# Patient Record
Sex: Female | Born: 1990
Health system: Southern US, Community
[De-identification: ages and names within clinical notes are randomized; demographics above are authoritative.]

## PROBLEM LIST (undated history)

## (undated) DIAGNOSIS — N946 Dysmenorrhea, unspecified: Secondary | ICD-10-CM

## (undated) DIAGNOSIS — K8020 Calculus of gallbladder without cholecystitis without obstruction: Secondary | ICD-10-CM

## (undated) DIAGNOSIS — R51 Headache: Secondary | ICD-10-CM

## (undated) HISTORY — PX: TEAR DUCT PROBING: SHX793

## (undated) HISTORY — DX: Dysmenorrhea, unspecified: N94.6

---

## 2014-03-16 ENCOUNTER — Encounter (HOSPITAL_COMMUNITY): Payer: Self-pay | Admitting: Emergency Medicine

## 2014-03-16 ENCOUNTER — Emergency Department (HOSPITAL_COMMUNITY)
Admission: EM | Admit: 2014-03-16 | Discharge: 2014-03-16 | Disposition: A | Discharge status: Home / Self Care | Payer: Self-pay | Attending: Emergency Medicine | Admitting: Emergency Medicine

## 2014-03-16 ENCOUNTER — Emergency Department (HOSPITAL_COMMUNITY): Payer: Self-pay

## 2014-03-16 DIAGNOSIS — K8020 Calculus of gallbladder without cholecystitis without obstruction: Secondary | ICD-10-CM | POA: Insufficient documentation

## 2014-03-16 DIAGNOSIS — R1011 Right upper quadrant pain: Secondary | ICD-10-CM

## 2014-03-16 DIAGNOSIS — Z3202 Encounter for pregnancy test, result negative: Secondary | ICD-10-CM | POA: Insufficient documentation

## 2014-03-16 DIAGNOSIS — Z720 Tobacco use: Secondary | ICD-10-CM | POA: Insufficient documentation

## 2014-03-16 LAB — URINALYSIS, ROUTINE W REFLEX MICROSCOPIC
BILIRUBIN URINE: NEGATIVE
GLUCOSE, UA: NEGATIVE mg/dL
Hgb urine dipstick: NEGATIVE
Ketones, ur: NEGATIVE mg/dL
Nitrite: NEGATIVE
PH: 6.5 (ref 5.0–8.0)
Protein, ur: NEGATIVE mg/dL
SPECIFIC GRAVITY, URINE: 1.027 (ref 1.005–1.030)
Urobilinogen, UA: 1 mg/dL (ref 0.0–1.0)

## 2014-03-16 LAB — COMPREHENSIVE METABOLIC PANEL
ALT: 16 U/L (ref 0–35)
AST: 14 U/L (ref 0–37)
Albumin: 3.8 g/dL (ref 3.5–5.2)
Alkaline Phosphatase: 48 U/L (ref 39–117)
Anion gap: 14 (ref 5–15)
BUN: 11 mg/dL (ref 6–23)
CHLORIDE: 100 meq/L (ref 96–112)
CO2: 22 meq/L (ref 19–32)
CREATININE: 0.68 mg/dL (ref 0.50–1.10)
Calcium: 9.5 mg/dL (ref 8.4–10.5)
GFR calc Af Amer: >90 mL/min (ref >90)
Glucose, Bld: 138 mg/dL — ABNORMAL HIGH (ref 70–99)
Potassium: 3.9 mEq/L (ref 3.7–5.3)
Sodium: 136 mEq/L — ABNORMAL LOW (ref 137–147)
Total Bilirubin: 0.2 mg/dL — ABNORMAL LOW (ref 0.3–1.2)
Total Protein: 8.2 g/dL (ref 6.0–8.3)

## 2014-03-16 LAB — CBC WITH DIFFERENTIAL/PLATELET
Basophils Absolute: 0 10*3/uL (ref 0.0–0.1)
Basophils Relative: 0 % (ref 0–1)
Eosinophils Absolute: 0.4 10*3/uL (ref 0.0–0.7)
Eosinophils Relative: 6 % — ABNORMAL HIGH (ref 0–5)
HEMATOCRIT: 40.9 % (ref 36.0–46.0)
HEMOGLOBIN: 13.5 g/dL (ref 12.0–15.0)
LYMPHS PCT: 52 % — AB (ref 12–46)
Lymphs Abs: 3.7 10*3/uL (ref 0.7–4.0)
MCH: 28.1 pg (ref 26.0–34.0)
MCHC: 33 g/dL (ref 30.0–36.0)
MCV: 85 fL (ref 78.0–100.0)
MONO ABS: 0.6 10*3/uL (ref 0.1–1.0)
Monocytes Relative: 8 % (ref 3–12)
NEUTROS PCT: 34 % — AB (ref 43–77)
Neutro Abs: 2.4 10*3/uL (ref 1.7–7.7)
Platelets: 278 10*3/uL (ref 150–400)
RBC: 4.81 MIL/uL (ref 3.87–5.11)
RDW: 12.5 % (ref 11.5–15.5)
WBC: 7.1 10*3/uL (ref 4.0–10.5)

## 2014-03-16 LAB — URINE MICROSCOPIC-ADD ON

## 2014-03-16 LAB — POC URINE PREG, ED: Preg Test, Ur: NEGATIVE

## 2014-03-16 LAB — LIPASE, BLOOD: LIPASE: 22 U/L (ref 11–59)

## 2014-03-16 MED ORDER — MORPHINE SULFATE 4 MG/ML IJ SOLN
2.0000 mg | Freq: Once | INTRAMUSCULAR | Status: AC
  Administered 2014-03-16: 2 mg via INTRAVENOUS
  Filled 2014-03-16: qty 1

## 2014-03-16 MED ORDER — SODIUM CHLORIDE 0.9 % IV BOLUS (SEPSIS)
1000.0000 mL | Freq: Once | INTRAVENOUS | Status: AC
  Administered 2014-03-16: 1000 mL via INTRAVENOUS

## 2014-03-16 MED ORDER — FAMOTIDINE IN NACL 20-0.9 MG/50ML-% IV SOLN
20.0000 mg | Freq: Once | INTRAVENOUS | Status: AC
  Administered 2014-03-16: 20 mg via INTRAVENOUS
  Filled 2014-03-16: qty 50

## 2014-03-16 MED ORDER — HYDROCODONE-ACETAMINOPHEN 5-325 MG PO TABS: 1.0000 | ORAL_TABLET | Freq: Four times a day (QID) | ORAL | Status: DC | PRN

## 2014-03-16 MED ORDER — ONDANSETRON HCL 4 MG PO TABS: 4.0000 mg | ORAL_TABLET | Freq: Four times a day (QID) | ORAL | Status: DC

## 2014-03-16 MED ORDER — ONDANSETRON HCL 4 MG/2ML IJ SOLN
4.0000 mg | Freq: Once | INTRAMUSCULAR | Status: AC
  Administered 2014-03-16: 4 mg via INTRAVENOUS
  Filled 2014-03-16: qty 2

## 2014-03-16 NOTE — ED Notes (Signed)
Per PT[facility]: Pt c/o R flank pain radiating to R lower abdomen. Pt states pain started shortly after eating ice cream this evening. Pt reports feeling very nauseous, dry heaving but no vomiting. Denies urinary sx. A&O x 4. Ambulatory.

## 2014-03-16 NOTE — ED Provider Notes (Signed)
CSN: [id number]     Arrival date & time 03/16/14  0119 History   First MD Initiated Contact with Lisa Palmer 03/16/14 0246     Chief Complaint  Lisa Palmer presents with  . Flank Pain  . Abdominal Pain  . Nausea    (Consider location/radiation/quality/duration/timing/severity/associated sxs/prior Treatment) HPI Comments: Lisa Palmer is a 23 year old female who presents to the emergency department for further evaluation of abdominal pain. Lisa Palmer states that she has been experiencing pain in her right mid side as well as her right upper abdomen intermittently over the past week. She states that her pain this evening began at 2300 yesterday. She describes the pain as sharp and radiating from her right thoracic back to her right upper quadrant. Symptoms associated with nausea as well as emesis. She states that experiencing an episode of emesis usually improves her pain. She has tried taking ibuprofen with no relief of symptoms. She states that she had also tried dietary changes with past week to try and alleviate symptom recurrence. Lisa Palmer denies history of abdominal surgeries as well as associated fever, chest pain, shortness of breath, diarrhea, melena or hematochezia, hematemesis, urinary complaints, and vaginal symptoms. She does endorse frequent consumption of fatty foods.  Lisa Palmer is a 23 y.o. female presenting with flank pain and abdominal pain. The history is provided by the Lisa Palmer. No language interpreter was used.  Flank Pain Associated symptoms include abdominal pain, nausea and vomiting. Pertinent negatives include no chest pain or fever.  Abdominal Pain Associated symptoms: nausea and vomiting   Associated symptoms: no chest pain, no dysuria, no fever, no hematuria, no shortness of breath, no vaginal bleeding and no vaginal discharge     History reviewed. No pertinent past medical history. History reviewed. No pertinent past surgical history. No family history on file. History  Substance  Use Topics  . Smoking status: Current Every Day Smoker -- 0.25 packs/day  . Smokeless tobacco: Not on file  . Alcohol Use: Yes     Comment: occasional   OB History    No data available      Review of Systems  Constitutional: Negative for fever.  Respiratory: Negative for shortness of breath.   Cardiovascular: Negative for chest pain.  Gastrointestinal: Positive for nausea, vomiting and abdominal pain.  Genitourinary: Positive for flank pain. Negative for dysuria, hematuria, vaginal bleeding and vaginal discharge.  Neurological: Negative for syncope.  All other systems reviewed and are negative.   Allergies  Augmentin  Home Medications   Prior to Admission medications   Medication Sig Start Date End Date Taking? Authorizing Provider  ibuprofen (ADVIL,MOTRIN) 200 MG tablet Take 400 mg by mouth every 6 (six) hours as needed for moderate pain.   Yes Historical Provider, MD  HYDROcodone-acetaminophen (NORCO/VICODIN) 5-325 MG per tablet Take 1-2 tablets by mouth every 6 (six) hours as needed for moderate pain or severe pain. 03/16/14   Antony MaduraKelly Correll Denbow, PA-C  ondansetron (ZOFRAN) 4 MG tablet Take 1 tablet (4 mg total) by mouth every 6 (six) hours. As needed for nausea 03/16/14   Antony MaduraKelly Vannie Hilgert, PA-C   BP 124/74 mmHg  Pulse 68  Temp(Src) 98.1 F (36.7 C) (Oral)  Resp 18  SpO2 99%  LMP 03/02/2014   Physical Exam  Constitutional: She is oriented to person, place, and time. She appears well-developed and well-nourished. No distress.  Nontoxic/nonseptic appearing  HENT:  Head: Normocephalic and atraumatic.  Mouth/Throat: Oropharynx is clear and moist. No oropharyngeal exudate.  Eyes: Conjunctivae and EOM are normal. No scleral icterus.  Neck: Normal range of motion.  Cardiovascular: Normal rate, regular rhythm and normal heart sounds.   Pulmonary/Chest: Effort normal. No respiratory distress. She has no wheezes. She has no rales.  Chest expansion symmetric. Respirations unlabored   Abdominal: Soft. She exhibits no distension and no mass. There is tenderness. There is no rebound and no guarding.  Tenderness to palpation in the right upper quadrant with negative Murphy sign. No masses or peritoneal signs. Abdomen soft.  Musculoskeletal: Normal range of motion.  Neurological: She is alert and oriented to person, place, and time. She exhibits normal muscle tone. Coordination normal.  GCS 15. Speech is goal oriented.  Skin: Skin is warm and dry. No rash noted. She is not diaphoretic. No erythema. No pallor.  Psychiatric: She has a normal mood and affect. Her behavior is normal.  Nursing note and vitals reviewed.   ED Course  Procedures (including critical care time) Labs Review Labs Reviewed  CBC WITH DIFFERENTIAL - Abnormal; Notable for the following:    Neutrophils Relative % 34 (*)    Lymphocytes Relative 52 (*)    Eosinophils Relative 6 (*)    All other components within normal limits  COMPREHENSIVE METABOLIC PANEL - Abnormal; Notable for the following:    Sodium 136 (*)    Glucose, Bld 138 (*)    Total Bilirubin 0.2 (*)    All other components within normal limits  URINALYSIS, ROUTINE W REFLEX MICROSCOPIC - Abnormal; Notable for the following:    APPearance CLOUDY (*)    Leukocytes, UA SMALL (*)    All other components within normal limits  URINE MICROSCOPIC-ADD ON - Abnormal; Notable for the following:    Bacteria, UA MANY (*)    All other components within normal limits  LIPASE, BLOOD  POC URINE PREG, ED    Imaging Review [place] Abdomen Limited Ruq  03/16/2014   CLINICAL DATA:  RIGHT upper quadrant pain. Symptoms began this evening after eating ice cream.  EXAM: US ABDOMEN LIMITED - RIGHT UPPER QUADRANT  COMPARISON:  None.  FINDINGS: Gallbladder:  Two echogenic gallstones with acoustic shadowing measure up to 1.2 cm. No gallbladder wall thickening, pericholecystic fluid. No sonographic Murphy's sign elicited.  Common bile duct:  Diameter: 3 mm  Liver:  No  focal lesion identified. Within normal limits in parenchymal echogenicity.  IMPRESSION: Cholelithiasis without sonographic findings of acute cholecystitis.   Electronically Signed   By: Awilda Metroourtnay  Bloomer   On: 03/16/2014 04:32     EKG Interpretation None      MDM   Final diagnoses:  RUQ pain  Calculus of gallbladder without cholecystitis without obstruction    23 year old female presents to the emergency department for further evaluation of pain to her right lateral mid back which radiates around to her right upper quadrant. Symptoms associated with nausea and emesis. She states that emesis usually improves her pain. Lisa Palmer is willing nontoxic appearing, hemodynamically stable, and afebrile. Focal tenderness elicited in the right upper quadrant with a negative Murphy sign. No masses or peritoneal signs. Lisa Palmer noted have no cytosis, anemia, or electrolyte imbalance. Liver and kidney function preserved. Lipase normal.  Ultrasound ordered for further evaluation of symptoms which shows cholelithiasis without acute cholecystitis. No physical exam findings to suggest clinical cholecystitis. Suspected biliary colic is responsible for cause of Lisa Palmer's pain today. She does endorse eating fatty foods frequently which is likely exacerbating her symptoms. Lisa Palmer has been able to tolerate fluids in the emergency department without emesis. She states that she is  feeling better since her arrival. Do not believe further emergent workup is indicated and the Lisa Palmer is stable for discharge with referral to [facility] to follow-up on an outpatient basis. Will prescribe Norco and Zofran for symptom management. Return precautions discussed and provided. Lisa Palmer agreeable to plan with no unaddressed concerns. Lisa Palmer discharged in good condition; VSS.   Filed Vitals:   03/16/14 0122 03/16/14 0344  BP: 148/93 124/74  Pulse: 95 68  Temp: 98.1 F (36.7 C)   TempSrc: Oral   Resp: 20 18  SpO2:  100% 99%     Antony Madura, PA-C 03/16/14 0757  Lyanne Co, MD 03/16/14 [phone]

## 2014-03-16 NOTE — ED Notes (Signed)
Bed: [id number] Expected date: 03/16/14 Expected time:  Means of arrival: Hospital Transport Comments: EMS/flank pain

## 2014-03-16 NOTE — ED Notes (Signed)
POC Urine Preg resulted Neg.

## 2014-03-16 NOTE — Discharge Instructions (Signed)
Cholelithiasis °Cholelithiasis (also called gallstones) is a form of gallbladder disease in which gallstones form in your gallbladder. The gallbladder is an organ that stores bile made in the liver, which helps digest fats. Gallstones begin as small crystals and slowly grow into stones. Gallstone pain occurs when the gallbladder spasms and a gallstone is blocking the duct. Pain can also occur when a stone passes out of the duct.  °RISK FACTORS °· Being female.   °· Having multiple pregnancies. Health care providers sometimes advise removing diseased gallbladders before future pregnancies.   °· Being obese. °· Eating a diet heavy in fried foods and fat.   °· Being older than 60 years and increasing age.   °· Prolonged use of medicines containing female hormones.   °· Having diabetes mellitus.   °· Rapidly losing weight.   °· Having a family history of gallstones (heredity).   °SYMPTOMS °· Nausea.   °· Vomiting. °· Abdominal pain.   °· Yellowing of the skin (jaundice).   °· Sudden pain. It may persist from several minutes to several hours. °· Fever.   °· Tenderness to the touch.  °In some cases, when gallstones do not move into the bile duct, people have no pain or symptoms. These are called "silent" gallstones.  °TREATMENT °Silent gallstones do not need treatment. In severe cases, emergency surgery may be required. Options for treatment include: °· Surgery to remove the gallbladder. This is the most common treatment. °· Medicines. These do not always work and may take 6-12 months or more to work. °· Shock wave treatment (extracorporeal biliary lithotripsy). In this treatment an ultrasound machine sends shock waves to the gallbladder to break gallstones into smaller pieces that can pass into the intestines or be dissolved by medicine. °HOME CARE INSTRUCTIONS  °· Only take over-the-counter or prescription medicines for pain, discomfort, or fever as directed by your health care provider.   °· Follow a low-fat diet until  seen again by your health care provider. Fat causes the gallbladder to contract, which can result in pain.   °· Follow up with your health care provider as directed. Attacks are almost always recurrent and surgery is usually required for permanent treatment.   °SEEK IMMEDIATE MEDICAL CARE IF:  °· Your pain increases and is not controlled by medicines.   °· You have a fever or persistent symptoms for more than 2-3 days.   °· You have a fever and your symptoms suddenly get worse.   °· You have persistent nausea and vomiting.   °MAKE SURE YOU:  °· Understand these instructions. °· Will watch your condition. °· Will get help right away if you are not doing well or get worse. °Document Released: 04/10/2005 Document Revised: 12/15/2012 Document Reviewed: 10/06/2012 °Exit[employer]® Patient Information ©2015 [employer]. This information is not intended to replace advice given to you by your health care provider. Make sure you discuss any questions you have with your health care provider. ° °Biliary Colic  °Biliary colic is a steady or irregular pain in the upper abdomen. It is usually under the right side of the rib cage. It happens when gallstones interfere with the normal flow of bile from the gallbladder. Bile is a liquid that helps to digest fats. Bile is made in the liver and stored in the gallbladder. When you eat a meal, bile passes from the gallbladder through the cystic duct and the common bile duct into the small intestine. There, it mixes with partially digested food. If a gallstone blocks either of these ducts, the normal flow of bile is blocked. The muscle cells in the bile duct contract   forcefully to try to move the stone. This causes the pain of biliary colic.  SYMPTOMS   A person with biliary colic usually complains of pain in the upper abdomen. This pain can be:  In the center of the upper abdomen just below the breastbone.  In the upper-right part of the abdomen, near the gallbladder and  liver.  Spread back toward the right shoulder blade.  Nausea and vomiting.  The pain usually occurs after eating.  Biliary colic is usually triggered by the digestive system's demand for bile. The demand for bile is high after fatty meals. Symptoms can also occur when a person who has been fasting suddenly eats a very large meal. Most episodes of biliary colic pass after 1 to 5 hours. After the most intense pain passes, your abdomen may continue to ache mildly for about 24 hours. DIAGNOSIS  After you describe your symptoms, your caregiver will perform a physical exam. He or she will pay attention to the upper right portion of your belly (abdomen). This is the area of your liver and gallbladder. An ultrasound will help your caregiver look for gallstones. Specialized scans of the gallbladder may also be done. Blood tests may be done, especially if you have fever or if your pain persists. PREVENTION  Biliary colic can be prevented by controlling the risk factors for gallstones. Some of these risk factors, such as heredity, increasing age, and pregnancy are a normal part of life. Obesity and a high-fat diet are risk factors you can change through a healthy lifestyle. Women going through menopause who take hormone replacement therapy (estrogen) are also more likely to develop biliary colic. TREATMENT   Pain medication may be prescribed.  You may be encouraged to eat a fat-free diet.  If the first episode of biliary colic is severe, or episodes of colic keep retuning, surgery to remove the gallbladder (cholecystectomy) is usually recommended. This procedure can be done through small incisions using an instrument called a laparoscope. The procedure often requires a brief stay in the hospital. Some people can leave the hospital the same day. It is the most widely used treatment in people troubled by painful gallstones. It is effective and safe, with no complications in more than 90% of cases.  If  surgery cannot be done, medication that dissolves gallstones may be used. This medication is expensive and can take months or years to work. Only small stones will dissolve.  Rarely, medication to dissolve gallstones is combined with a procedure called shock-wave lithotripsy. This procedure uses carefully aimed shock waves to break up gallstones. In many people treated with this procedure, gallstones form again within a few years. PROGNOSIS  If gallstones block your cystic duct or common bile duct, you are at risk for repeated episodes of biliary colic. There is also a 25% chance that you will develop a gallbladder infection(acute cholecystitis), or some other complication of gallstones within 10 to 20 years. If you have surgery, schedule it at a time that is convenient for you and at a time when you are not sick. HOME CARE INSTRUCTIONS   Drink plenty of clear fluids.  Avoid fatty, greasy or fried foods, or any foods that make your pain worse.  Take medications as directed. SEEK MEDICAL CARE IF:   You develop a fever over 100.5 F (38.1 C).  Your pain gets worse over time.  You develop nausea that prevents you from eating and drinking.  You develop vomiting. SEEK IMMEDIATE MEDICAL CARE IF:  You have continuous or severe belly (abdominal) pain which is not relieved with medications.  You develop nausea and vomiting which is not relieved with medications.  You have symptoms of biliary colic and you suddenly develop a fever and shaking chills. This may signal cholecystitis. Call your caregiver immediately.  You develop a yellow color to your skin or the white part of your eyes (jaundice). Document Released: 09/15/2005 Document Revised: 07/07/2011 Document Reviewed: 11/25/2007 [facility] Patient Information 2015 [employer], [place]. This information is not intended to replace advice given to you by your health care provider. Make sure you discuss any questions you have with your health care  provider.

## 2014-06-03 ENCOUNTER — Encounter (HOSPITAL_COMMUNITY): Payer: Self-pay | Admitting: *Deleted

## 2014-06-03 ENCOUNTER — Emergency Department (INDEPENDENT_AMBULATORY_CARE_PROVIDER_SITE_OTHER)
Admission: EM | Admit: 2014-06-03 | Discharge: 2014-06-03 | Disposition: A | Discharge status: Home / Self Care | Payer: No Typology Code available for payment source | Source: Home / Self Care | Attending: Emergency Medicine | Admitting: Emergency Medicine

## 2014-06-03 DIAGNOSIS — B9789 Other viral agents as the cause of diseases classified elsewhere: Principal | ICD-10-CM

## 2014-06-03 DIAGNOSIS — J069 Acute upper respiratory infection, unspecified: Secondary | ICD-10-CM

## 2014-06-03 MED ORDER — CETIRIZINE HCL 10 MG PO TABS: 10.0000 mg | ORAL_TABLET | Freq: Every day | ORAL | Status: DC

## 2014-06-03 MED ORDER — IPRATROPIUM BROMIDE 0.06 % NA SOLN: 2.0000 | Freq: Four times a day (QID) | NASAL | Status: DC

## 2014-06-03 MED ORDER — HYDROCODONE-HOMATROPINE 5-1.5 MG/5ML PO SYRP: 5.0000 mL | ORAL_SOLUTION | Freq: Four times a day (QID) | ORAL | Status: DC | PRN

## 2014-06-03 NOTE — ED Provider Notes (Signed)
CSN: [id number]     Arrival date & time 06/03/14  1452 History   First MD Initiated Contact with Patient 06/03/14 1527     Chief Complaint  Patient presents with  . URI   (Consider location/radiation/quality/duration/timing/severity/associated sxs/prior Treatment) HPI She's a 24 year old woman here for evaluation of cold symptoms. Her symptoms started about 3 days ago with cough, nasal congestion, diarrhea. No fever or chills. No headaches. No sore throat. No nausea or vomiting. She has tried over-the-counter cough syrups without much improvement.  History reviewed. No pertinent past medical history. History reviewed. No pertinent past surgical history. History reviewed. No pertinent family history. History  Substance Use Topics  . Smoking status: Current Every Day Smoker -- 0.25 packs/day  . Smokeless tobacco: Not on file  . Alcohol Use: Yes     Comment: occasional   OB History    No data available     Review of Systems  Constitutional: Negative for fever and chills.  HENT: Positive for congestion and rhinorrhea. Negative for sinus pressure and sore throat.   Respiratory: Positive for cough. Negative for shortness of breath.   Gastrointestinal: Positive for diarrhea. Negative for nausea and vomiting.  Musculoskeletal: Negative for myalgias.  Neurological: Negative for headaches.    Allergies  Augmentin  Home Medications   Prior to Admission medications   Medication Sig Start Date End Date Taking? Authorizing Provider  cetirizine (ZYRTEC) 10 MG tablet Take 1 tablet (10 mg total) by mouth daily. 06/03/14   Charm RingsErin J Dimitry Holsworth, MD  HYDROcodone-acetaminophen (NORCO/VICODIN) 5-325 MG per tablet Take 1-2 tablets by mouth every 6 (six) hours as needed for moderate pain or severe pain. 03/16/14   Antony MaduraKelly Humes, PA-C  HYDROcodone-homatropine (HYCODAN) 5-1.5 MG/5ML syrup Take 5 mLs by mouth every 6 (six) hours as needed for cough. 06/03/14   Charm RingsErin J Almarosa Bohac, MD  ibuprofen (ADVIL,MOTRIN) 200 MG  tablet Take 400 mg by mouth every 6 (six) hours as needed for moderate pain.    Historical Provider, MD  ipratropium (ATROVENT) 0.06 % nasal spray Place 2 sprays into both nostrils 4 (four) times daily. 06/03/14   Charm RingsErin J Maedell Hedger, MD  ondansetron (ZOFRAN) 4 MG tablet Take 1 tablet (4 mg total) by mouth every 6 (six) hours. As needed for nausea 03/16/14   Antony MaduraKelly Humes, PA-C   BP 140/92 mmHg  Pulse 90  Temp(Src) 99.4 F (37.4 C) (Oral)  Resp 18  SpO2 99% Physical Exam  Constitutional: She is oriented to person, place, and time. She appears well-developed and well-nourished. No distress.  HENT:  Head: Normocephalic and atraumatic.  Right Ear: External ear normal.  Left Ear: External ear normal.  Nose: Mucosal edema and rhinorrhea present.  Mouth/Throat: Posterior oropharyngeal erythema present. No oropharyngeal exudate.  Neck: Neck supple.  Cardiovascular: Normal rate, regular rhythm and normal heart sounds.   No murmur heard. Pulmonary/Chest: Effort normal and breath sounds normal. No respiratory distress. She has no wheezes. She has no rales.  Lymphadenopathy:    She has no cervical adenopathy.  Neurological: She is alert and oriented to person, place, and time.    ED Course  Procedures (including critical care time) Labs Review Labs Reviewed - No data to display  Imaging Review No results found.   MDM   1. Viral URI with cough    Symptomatic treatment with Zyrtec and Atrovent nasal spray. Discussed expected time course. Follow-up as needed.    Charm RingsErin J Jaylyn Booher, MD 06/03/14 [phone]

## 2014-06-03 NOTE — Discharge Instructions (Signed)
You have a bad cold. Take Zyrtec daily. Use Atrovent nasal spray 4 times a day. You can use over-the-counter Afrin twice a day for no more than 3 days. You should see improvement by Wednesday. Follow-up as needed.

## 2014-06-03 NOTE — ED Notes (Signed)
Pt      Reports        Symptoms     Of  Cough    Congested           Body  Aches   As    Well  As  Diarrhea   With   Onset  Of  Symptoms      X  3  Days  - pt  Is  Sitting  Upright on  The  Exam table  Speaking in  Complete  sentances

## 2014-07-16 ENCOUNTER — Emergency Department (HOSPITAL_COMMUNITY)
Admission: EM | Admit: 2014-07-16 | Discharge: 2014-07-16 | Disposition: A | Discharge status: Home / Self Care | Payer: No Typology Code available for payment source | Attending: Emergency Medicine | Admitting: Emergency Medicine

## 2014-07-16 ENCOUNTER — Encounter (HOSPITAL_COMMUNITY): Payer: Self-pay | Admitting: Emergency Medicine

## 2014-07-16 ENCOUNTER — Emergency Department (HOSPITAL_COMMUNITY): Payer: No Typology Code available for payment source

## 2014-07-16 DIAGNOSIS — Z720 Tobacco use: Secondary | ICD-10-CM | POA: Diagnosis not present

## 2014-07-16 DIAGNOSIS — R1011 Right upper quadrant pain: Secondary | ICD-10-CM | POA: Diagnosis present

## 2014-07-16 DIAGNOSIS — Z79899 Other long term (current) drug therapy: Secondary | ICD-10-CM | POA: Diagnosis not present

## 2014-07-16 DIAGNOSIS — Z3202 Encounter for pregnancy test, result negative: Secondary | ICD-10-CM | POA: Insufficient documentation

## 2014-07-16 DIAGNOSIS — K8020 Calculus of gallbladder without cholecystitis without obstruction: Secondary | ICD-10-CM | POA: Insufficient documentation

## 2014-07-16 DIAGNOSIS — K8050 Calculus of bile duct without cholangitis or cholecystitis without obstruction: Secondary | ICD-10-CM

## 2014-07-16 DIAGNOSIS — R109 Unspecified abdominal pain: Secondary | ICD-10-CM

## 2014-07-16 LAB — CBC WITH DIFFERENTIAL/PLATELET
Basophils Absolute: 0 10*3/uL (ref 0.0–0.1)
Basophils Relative: 0 % (ref 0–1)
Eosinophils Absolute: 0.4 10*3/uL (ref 0.0–0.7)
Eosinophils Relative: 6 % — ABNORMAL HIGH (ref 0–5)
HCT: 40.3 % (ref 36.0–46.0)
Hemoglobin: 13.2 g/dL (ref 12.0–15.0)
LYMPHS ABS: 2.4 10*3/uL (ref 0.7–4.0)
Lymphocytes Relative: 37 % (ref 12–46)
MCH: 28 pg (ref 26.0–34.0)
MCHC: 32.8 g/dL (ref 30.0–36.0)
MCV: 85.4 fL (ref 78.0–100.0)
MONO ABS: 0.3 10*3/uL (ref 0.1–1.0)
Monocytes Relative: 5 % (ref 3–12)
NEUTROS PCT: 52 % (ref 43–77)
Neutro Abs: 3.5 10*3/uL (ref 1.7–7.7)
PLATELETS: 338 10*3/uL (ref 150–400)
RBC: 4.72 MIL/uL (ref 3.87–5.11)
RDW: 12.4 % (ref 11.5–15.5)
WBC: 6.7 10*3/uL (ref 4.0–10.5)

## 2014-07-16 LAB — HEPATIC FUNCTION PANEL
ALBUMIN: 3.9 g/dL (ref 3.5–5.2)
ALT: 19 U/L (ref 0–35)
AST: 18 U/L (ref 0–37)
Alkaline Phosphatase: 44 U/L (ref 39–117)
BILIRUBIN TOTAL: 0.7 mg/dL (ref 0.3–1.2)
Bilirubin, Direct: 0.2 mg/dL (ref 0.0–0.5)
Indirect Bilirubin: 0.5 mg/dL (ref 0.3–0.9)
TOTAL PROTEIN: 7.5 g/dL (ref 6.0–8.3)

## 2014-07-16 LAB — URINE MICROSCOPIC-ADD ON

## 2014-07-16 LAB — URINALYSIS, ROUTINE W REFLEX MICROSCOPIC
BILIRUBIN URINE: NEGATIVE
Glucose, UA: NEGATIVE mg/dL
HGB URINE DIPSTICK: NEGATIVE
Ketones, ur: 15 mg/dL — AB
Nitrite: NEGATIVE
PH: 6.5 (ref 5.0–8.0)
PROTEIN: NEGATIVE mg/dL
Specific Gravity, Urine: 1.017 (ref 1.005–1.030)
Urobilinogen, UA: 1 mg/dL (ref 0.0–1.0)

## 2014-07-16 LAB — LIPASE, BLOOD: LIPASE: 19 U/L (ref 11–59)

## 2014-07-16 LAB — PREGNANCY, URINE: PREG TEST UR: NEGATIVE

## 2014-07-16 MED ORDER — HYOSCYAMINE SULFATE 0.125 MG PO TABS: 0.1250 mg | ORAL_TABLET | ORAL | Status: DC | PRN

## 2014-07-16 MED ORDER — MORPHINE SULFATE 4 MG/ML IJ SOLN
4.0000 mg | Freq: Once | INTRAMUSCULAR | Status: AC
  Administered 2014-07-16: 4 mg via INTRAVENOUS
  Filled 2014-07-16: qty 1

## 2014-07-16 MED ORDER — HYDROCODONE-ACETAMINOPHEN 5-325 MG PO TABS: 1.0000 | ORAL_TABLET | Freq: Four times a day (QID) | ORAL | Status: DC | PRN

## 2014-07-16 MED ORDER — ONDANSETRON HCL 4 MG/2ML IJ SOLN
4.0000 mg | Freq: Once | INTRAMUSCULAR | Status: AC
  Administered 2014-07-16: 4 mg via INTRAVENOUS
  Filled 2014-07-16: qty 2

## 2014-07-16 MED ORDER — ONDANSETRON HCL 4 MG PO TABS: 4.0000 mg | ORAL_TABLET | Freq: Four times a day (QID) | ORAL | Status: DC

## 2014-07-16 MED ORDER — HYOSCYAMINE SULFATE 0.125 MG PO TABS
0.1250 mg | ORAL_TABLET | Freq: Once | ORAL | Status: AC
  Administered 2014-07-16: 0.125 mg via ORAL
  Filled 2014-07-16: qty 1

## 2014-07-16 NOTE — ED Notes (Signed)
Pt. Left with all belongings and refused wheelchair 

## 2014-07-16 NOTE — ED Provider Notes (Signed)
CSN: [id number]     Arrival date & time 07/16/14  0209 History  This chart was scribed for Marisa Severin, MD by Annye Asa, ED Scribe. This patient was seen in room A13C/A13C and the patient's care was started at 3:03 AM.    Chief Complaint  Patient presents with  . Abdominal Pain  . Flank Pain    Patient is a 24 y.o. female presenting with abdominal pain and flank pain. The history is provided by the patient. No language interpreter was used.  Abdominal Pain Associated symptoms: nausea and vomiting   Associated symptoms: no fever   Flank Pain Associated symptoms include abdominal pain.     HPI Comments: Lisa Palmer is a 24 y.o. female who presents to the Emergency Department complaining of 3 hours of RUQ and right flank pain with associated nausea and vomiting. Patient reports that she was diagnosed with gallstones about 6 months PTA and regularly has flare ups lasting approx. 30 minutes; tonight's pain persisted, prompting her to come to the ED. She states she does not take any pain medication with these flare ups; her pain is normally relieved with vomiting. She denies fevers.   She states she was told at diagnosis she would need further follow up for potential cholecystectomy but she "feels like she needs her gallbladder and does not want to have it taken out."   LNMP last week.   History reviewed. No pertinent past medical history. History reviewed. No pertinent past surgical history. History reviewed. No pertinent family history. History  Substance Use Topics  . Smoking status: Current Every Day Smoker -- 0.25 packs/day  . Smokeless tobacco: Not on file  . Alcohol Use: Yes     Comment: occasional   OB History    No data available     Review of Systems  Constitutional: Negative for fever.  Gastrointestinal: Positive for nausea, vomiting and abdominal pain.  Genitourinary: Positive for flank pain.  All other systems reviewed and are negative.  Allergies   Augmentin  Home Medications   Prior to Admission medications   Medication Sig Start Date End Date Taking? Authorizing Provider  cetirizine (ZYRTEC) 10 MG tablet Take 1 tablet (10 mg total) by mouth daily. 06/03/14   Charm Rings, MD  HYDROcodone-acetaminophen (NORCO/VICODIN) 5-325 MG per tablet Take 1-2 tablets by mouth every 6 (six) hours as needed for moderate pain or severe pain. 03/16/14   Antony Madura, PA-C  HYDROcodone-homatropine (HYCODAN) 5-1.5 MG/5ML syrup Take 5 mLs by mouth every 6 (six) hours as needed for cough. 06/03/14   Charm Rings, MD  ibuprofen (ADVIL,MOTRIN) 200 MG tablet Take 400 mg by mouth every 6 (six) hours as needed for moderate pain.    Historical Provider, MD  ipratropium (ATROVENT) 0.06 % nasal spray Place 2 sprays into both nostrils 4 (four) times daily. 06/03/14   Charm Rings, MD  ondansetron (ZOFRAN) 4 MG tablet Take 1 tablet (4 mg total) by mouth every 6 (six) hours. As needed for nausea 03/16/14   Antony Madura, PA-C   BP 134/94 mmHg  Pulse 64  Temp(Src) 97.4 F (36.3 C) (Oral)  Resp 12  Ht  (1.626 m)  Wt 180 lb (81.647 kg)  BMI 30.88 kg/m2  SpO2 100%  LMP 07/09/2014 (Exact Date) Physical Exam  Constitutional: She is oriented to person, place, and time. She appears well-developed and well-nourished. No distress.  HENT:  Head: Normocephalic and atraumatic.  Mouth/Throat: Oropharynx is clear and moist. No oropharyngeal exudate.  Moist mucous membranes  Eyes: EOM are normal. Pupils are equal, round, and reactive to light.  Neck: Normal range of motion. Neck supple. No JVD present.  Cardiovascular: Normal rate, regular rhythm and normal heart sounds.  Exam reveals no gallop and no friction rub.   No murmur heard. Pulmonary/Chest: Effort normal and breath sounds normal. No respiratory distress. She has no wheezes. She has no rales.  Abdominal: Soft. Bowel sounds are normal. She exhibits no mass. There is tenderness. There is no rebound and no guarding.   RUQ and right flank musculoskeletal pain  Musculoskeletal: Normal range of motion. She exhibits no edema.  Moves all extremities normally.   Lymphadenopathy:    She has no cervical adenopathy.  Neurological: She is alert and oriented to person, place, and time. She displays normal reflexes.  Skin: Skin is warm and dry. No rash noted.  Psychiatric: She has a normal mood and affect. Her behavior is normal.  Nursing note and vitals reviewed.   ED Course  Procedures   DIAGNOSTIC STUDIES: Oxygen Saturation is 99% on RA, normal by my interpretation.    COORDINATION OF CARE: 3:09 AM Discussed treatment plan with pt at bedside and pt agreed to plan.   Labs Review Labs Reviewed  CBC WITH DIFFERENTIAL/PLATELET - Abnormal; Notable for the following:    Eosinophils Relative 6 (*)    All other components within normal limits  HEPATIC FUNCTION PANEL  LIPASE, BLOOD  URINALYSIS, ROUTINE W REFLEX MICROSCOPIC  PREGNANCY, URINE    Imaging Review [place] Abdomen Limited  07/16/2014   CLINICAL DATA:  Right upper quadrant and right flank pain for 3 hours. Nausea and vomiting.  EXAM: US ABDOMEN LIMITED - RIGHT UPPER QUADRANT  COMPARISON:  None.  FINDINGS: Gallbladder:  Multiple calculi are present in the gallbladder lumen, measuring up to 16 mm. Gallbladder wall is normal in thickness. The patient was not tender over the gallbladder.  Common bile duct:  Diameter: 2.9 mm, normal  Liver:  There is mildly coarsened echotexture of the liver without discrete lesion. This is nonspecific but can be seen with fatty infiltration.  IMPRESSION: Cholelithiasis without evidence of cholecystitis. Mildly coarsened echotexture of the liver.   Electronically Signed   By: Ellery Plunkaniel R Mitchell M.D.   On: 07/16/2014 04:18     EKG Interpretation None      MDM   Final diagnoses:  Biliary colic  Right flank pain    I personally performed the services described in this documentation, which was scribed in my presence.  The recorded information has been reviewed and is accurate.  24 year old female with known gallstones who presents with 2 hours of right upper quadrant and right flank pain.  Patient reports she had nausea and vomiting with onset of pain.  She reports the symptoms are similar to her prior biliary colic attacks, but lasted longer than usual.  She denies any fever or chills.  Patient has not followed up with surgery.  She reports that she does not wish surgery at this time for her gallbladder.  Patient reports that if she stays away from bad food.  She is usually able to control her attacks.  Labs today are normal.  Ultrasound shows no signs of acute cholecystitis.  Awaiting urine.  I feel patient is stable for discharge home.    Marisa Severinlga Lezley Bedgood, MD 07/16/14 [phone]

## 2014-07-16 NOTE — Discharge Instructions (Signed)
Cholelithiasis °Cholelithiasis (also called gallstones) is a form of gallbladder disease in which gallstones form in your gallbladder. The gallbladder is an organ that stores bile made in the liver, which helps digest fats. Gallstones begin as small crystals and slowly grow into stones. Gallstone pain occurs when the gallbladder spasms and a gallstone is blocking the duct. Pain can also occur when a stone passes out of the duct.  °RISK FACTORS °· Being female.   °· Having multiple pregnancies. Health care providers sometimes advise removing diseased gallbladders before future pregnancies.   °· Being obese. °· Eating a diet heavy in fried foods and fat.   °· Being older than 60 years and increasing age.   °· Prolonged use of medicines containing female hormones.   °· Having diabetes mellitus.   °· Rapidly losing weight.   °· Having a family history of gallstones (heredity).   °SYMPTOMS °· Nausea.   °· Vomiting. °· Abdominal pain.   °· Yellowing of the skin (jaundice).   °· Sudden pain. It may persist from several minutes to several hours. °· Fever.   °· Tenderness to the touch.  °In some cases, when gallstones do not move into the bile duct, people have no pain or symptoms. These are called "silent" gallstones.  °TREATMENT °Silent gallstones do not need treatment. In severe cases, emergency surgery may be required. Options for treatment include: °· Surgery to remove the gallbladder. This is the most common treatment. °· Medicines. These do not always work and may take 6-12 months or more to work. °· Shock wave treatment (extracorporeal biliary lithotripsy). In this treatment an ultrasound machine sends shock waves to the gallbladder to break gallstones into smaller pieces that can pass into the intestines or be dissolved by medicine. °HOME CARE INSTRUCTIONS  °· Only take over-the-counter or prescription medicines for pain, discomfort, or fever as directed by your health care provider.   °· Follow a low-fat diet until  seen again by your health care provider. Fat causes the gallbladder to contract, which can result in pain.   °· Follow up with your health care provider as directed. Attacks are almost always recurrent and surgery is usually required for permanent treatment.   °SEEK IMMEDIATE MEDICAL CARE IF:  °· Your pain increases and is not controlled by medicines.   °· You have a fever or persistent symptoms for more than 2-3 days.   °· You have a fever and your symptoms suddenly get worse.   °· You have persistent nausea and vomiting.   °MAKE SURE YOU:  °· Understand these instructions. °· Will watch your condition. °· Will get help right away if you are not doing well or get worse. °Document Released: 04/10/2005 Document Revised: 12/15/2012 Document Reviewed: 10/06/2012 °[employer]® Patient Information ©2015 [employer]. This information is not intended to replace advice given to you by your health care provider. Make sure you discuss any questions you have with your health care provider. ° °Biliary Colic  °Biliary colic is a steady or irregular pain in the upper abdomen. It is usually under the right side of the rib cage. It happens when gallstones interfere with the normal flow of bile from the gallbladder. Bile is a liquid that helps to digest fats. Bile is made in the liver and stored in the gallbladder. When you eat a meal, bile passes from the gallbladder through the cystic duct and the common bile duct into the small intestine. There, it mixes with partially digested food. If a gallstone blocks either of these ducts, the normal flow of bile is blocked. The muscle cells in the bile duct contract   forcefully to try to move the stone. This causes the pain of biliary colic.  °SYMPTOMS  °· A person with biliary colic usually complains of pain in the upper abdomen. This pain can be: °¨ In the center of the upper abdomen just below the breastbone. °¨ In the upper-right part of the abdomen, near the gallbladder and  liver. °¨ Spread back toward the right shoulder blade. °· Nausea and vomiting. °· The pain usually occurs after eating. °· Biliary colic is usually triggered by the digestive system's demand for bile. The demand for bile is high after fatty meals. Symptoms can also occur when a person who has been fasting suddenly eats a very large meal. Most episodes of biliary colic pass after 1 to 5 hours. After the most intense pain passes, your abdomen may continue to ache mildly for about 24 hours. °DIAGNOSIS  °After you describe your symptoms, your caregiver will perform a physical exam. He or she will pay attention to the upper right portion of your belly (abdomen). This is the area of your liver and gallbladder. An ultrasound will help your caregiver look for gallstones. Specialized scans of the gallbladder may also be done. Blood tests may be done, especially if you have fever or if your pain persists. °PREVENTION  °Biliary colic can be prevented by controlling the risk factors for gallstones. Some of these risk factors, such as heredity, increasing age, and pregnancy are a normal part of life. Obesity and a high-fat diet are risk factors you can change through a healthy lifestyle. Women going through menopause who take hormone replacement therapy (estrogen) are also more likely to develop biliary colic. °TREATMENT  °· Pain medication may be prescribed. °· You may be encouraged to eat a fat-free diet. °· If the first episode of biliary colic is severe, or episodes of colic keep retuning, surgery to remove the gallbladder (cholecystectomy) is usually recommended. This procedure can be done through small incisions using an instrument called a laparoscope. The procedure often requires a brief stay in the hospital. Some people can leave the hospital the same day. It is the most widely used treatment in people troubled by painful gallstones. It is effective and safe, with no complications in more than 90% of cases. °· If  surgery cannot be done, medication that dissolves gallstones may be used. This medication is expensive and can take months or years to work. Only small stones will dissolve. °· Rarely, medication to dissolve gallstones is combined with a procedure called shock-wave lithotripsy. This procedure uses carefully aimed shock waves to break up gallstones. In many people treated with this procedure, gallstones form again within a few years. °PROGNOSIS  °If gallstones block your cystic duct or common bile duct, you are at risk for repeated episodes of biliary colic. There is also a 25% chance that you will develop a gallbladder infection(acute cholecystitis), or some other complication of gallstones within 10 to 20 years. If you have surgery, schedule it at a time that is convenient for you and at a time when you are not sick. °HOME CARE INSTRUCTIONS  °· Drink plenty of clear fluids. °· Avoid fatty, greasy or fried foods, or any foods that make your pain worse. °· Take medications as directed. °SEEK MEDICAL CARE IF:  °· You develop a fever over 100.5° F (38.1° C). °· Your pain gets worse over time. °· You develop nausea that prevents you from eating and drinking. °· You develop vomiting. °SEEK IMMEDIATE MEDICAL CARE IF:  °·   You have continuous or severe belly (abdominal) pain which is not relieved with medications. °· You develop nausea and vomiting which is not relieved with medications. °· You have symptoms of biliary colic and you suddenly develop a fever and shaking chills. This may signal cholecystitis. Call your caregiver immediately. °· You develop a yellow color to your skin or the white part of your eyes (jaundice). °Document Released: 09/15/2005 Document Revised: 07/07/2011 Document Reviewed: 11/25/2007 °ExitCare® Patient Information ©2015 [employer]. This information is not intended to replace advice given to you by your health care provider. Make sure you discuss any questions you have with your health care  provider. ° °Low-Fat Diet for Pancreatitis or Gallbladder Conditions °A low-fat diet can be helpful if you have pancreatitis or a gallbladder condition. With these conditions, your pancreas and gallbladder have trouble digesting fats. A healthy eating plan with less fat will help rest your pancreas and gallbladder and reduce your symptoms. °WHAT DO I NEED TO KNOW ABOUT THIS DIET? °· Eat a low-fat diet. °¨ Reduce your fat intake to less than 20-30% of your total daily calories. This is less than 50-60 g of fat per day. °¨ Remember that you need some fat in your diet. Ask your dietician what your daily goal should be. °¨ Choose nonfat and low-fat healthy foods. Look for the words "nonfat," "low fat," or "fat free." °¨ As a guide, look on the label and choose foods with less than 3 g of fat per serving. Eat only one serving. °· Avoid alcohol. °· Do not smoke. If you need help quitting, talk with your health care provider. °· Eat small frequent meals instead of three large heavy meals. °WHAT FOODS CAN I EAT? °Grains °Include healthy grains and starches such as potatoes, wheat bread, fiber-rich cereal, and brown rice. Choose whole grain options whenever possible. In adults, whole grains should account for 45-65% of your daily calories.  °Fruits and Vegetables °Eat plenty of fruits and vegetables. Fresh fruits and vegetables add fiber to your diet. °Meats and Other Protein Sources °Eat lean meat such as chicken and pork. Trim any fat off of meat before cooking it. Eggs, fish, and beans are other sources of protein. In adults, these foods should account for 10-35% of your daily calories. °Dairy °Choose low-fat milk and dairy options. Dairy includes fat and protein, as well as calcium.  °Fats and Oils °Limit high-fat foods such as fried foods, sweets, baked goods, sugary drinks.  °Other °Creamy sauces and condiments, such as mayonnaise, can add extra fat. Think about whether or not you need to use them, or use smaller  amounts or low fat options. °WHAT FOODS ARE NOT RECOMMENDED? °· High fat foods, such as: °¨ Baked goods. °¨ Ice cream. °¨ French toast. °¨ Sweet rolls. °¨ Pizza. °¨ Cheese bread. °¨ Foods covered with batter, butter, creamy sauces, or cheese. °¨ Fried foods. °¨ Sugary drinks and desserts. °· Foods that cause gas or bloating °Document Released: 04/19/2013 Document Reviewed: 04/19/2013 °[employer]® Patient Information ©2015 [employer]. This information is not intended to replace advice given to you by your health care provider. Make sure you discuss any questions you have with your health care provider. ° °

## 2014-07-16 NOTE — ED Notes (Signed)
Patient here with complaint of right lateral upper quadrant pain with radiation to right flank. States history of gallstones with periodic attacks. Attacks typically last about 30 mins. This one started around 0000 this AM. Nausea and vomiting reported as well.

## 2014-10-14 ENCOUNTER — Emergency Department (HOSPITAL_COMMUNITY)
Admission: EM | Admit: 2014-10-14 | Discharge: 2014-10-15 | Disposition: A | Discharge status: Home / Self Care | Payer: No Typology Code available for payment source | Attending: Emergency Medicine | Admitting: Emergency Medicine

## 2014-10-14 ENCOUNTER — Emergency Department (HOSPITAL_COMMUNITY): Payer: No Typology Code available for payment source

## 2014-10-14 ENCOUNTER — Encounter (HOSPITAL_COMMUNITY): Payer: Self-pay

## 2014-10-14 DIAGNOSIS — Z720 Tobacco use: Secondary | ICD-10-CM | POA: Diagnosis not present

## 2014-10-14 DIAGNOSIS — R55 Syncope and collapse: Secondary | ICD-10-CM | POA: Diagnosis not present

## 2014-10-14 DIAGNOSIS — R1011 Right upper quadrant pain: Secondary | ICD-10-CM | POA: Diagnosis present

## 2014-10-14 DIAGNOSIS — K8020 Calculus of gallbladder without cholecystitis without obstruction: Secondary | ICD-10-CM | POA: Diagnosis not present

## 2014-10-14 DIAGNOSIS — Z7951 Long term (current) use of inhaled steroids: Secondary | ICD-10-CM | POA: Insufficient documentation

## 2014-10-14 HISTORY — DX: Calculus of gallbladder without cholecystitis without obstruction: K80.20

## 2014-10-14 LAB — COMPREHENSIVE METABOLIC PANEL
ALT: 21 U/L (ref 14–54)
AST: 17 U/L (ref 15–41)
Albumin: 3.7 g/dL (ref 3.5–5.0)
Alkaline Phosphatase: 46 U/L (ref 38–126)
Anion gap: 11 (ref 5–15)
BUN: 10 mg/dL (ref 6–20)
CHLORIDE: 106 mmol/L (ref 101–111)
CO2: 21 mmol/L — AB (ref 22–32)
Calcium: 8.7 mg/dL — ABNORMAL LOW (ref 8.9–10.3)
Creatinine, Ser: 0.7 mg/dL (ref 0.44–1.00)
GFR calc Af Amer: >60 mL/min (ref >60)
GLUCOSE: 130 mg/dL — AB (ref 65–99)
POTASSIUM: 4.1 mmol/L (ref 3.5–5.1)
SODIUM: 138 mmol/L (ref 135–145)
TOTAL PROTEIN: 7.5 g/dL (ref 6.5–8.1)
Total Bilirubin: 0.7 mg/dL (ref 0.3–1.2)

## 2014-10-14 LAB — TROPONIN I: Troponin I: <0.03 ng/mL (ref <0.031)

## 2014-10-14 LAB — CBC WITH DIFFERENTIAL/PLATELET
Basophils Absolute: 0 10*3/uL (ref 0.0–0.1)
Basophils Relative: 0 % (ref 0–1)
Eosinophils Absolute: 0.1 10*3/uL (ref 0.0–0.7)
Eosinophils Relative: 1 % (ref 0–5)
HCT: 40.5 % (ref 36.0–46.0)
Hemoglobin: 13.6 g/dL (ref 12.0–15.0)
LYMPHS ABS: 1.2 10*3/uL (ref 0.7–4.0)
LYMPHS PCT: 13 % (ref 12–46)
MCH: 28.5 pg (ref 26.0–34.0)
MCHC: 33.6 g/dL (ref 30.0–36.0)
MCV: 84.7 fL (ref 78.0–100.0)
Monocytes Absolute: 0.3 10*3/uL (ref 0.1–1.0)
Monocytes Relative: 3 % (ref 3–12)
Neutro Abs: 8.2 10*3/uL — ABNORMAL HIGH (ref 1.7–7.7)
Neutrophils Relative %: 83 % — ABNORMAL HIGH (ref 43–77)
Platelets: 230 10*3/uL (ref 150–400)
RBC: 4.78 MIL/uL (ref 3.87–5.11)
RDW: 12.6 % (ref 11.5–15.5)
WBC: 9.8 10*3/uL (ref 4.0–10.5)

## 2014-10-14 LAB — LIPASE, BLOOD: Lipase: 15 U/L — ABNORMAL LOW (ref 22–51)

## 2014-10-14 MED ORDER — HYDROMORPHONE HCL 1 MG/ML IJ SOLN
1.0000 mg | Freq: Once | INTRAMUSCULAR | Status: AC
  Administered 2014-10-14: 1 mg via INTRAVENOUS
  Filled 2014-10-14: qty 1

## 2014-10-14 MED ORDER — ONDANSETRON HCL 4 MG/2ML IJ SOLN
4.0000 mg | Freq: Once | INTRAMUSCULAR | Status: AC
  Administered 2014-10-14: 4 mg via INTRAVENOUS
  Filled 2014-10-14: qty 2

## 2014-10-14 NOTE — ED Notes (Signed)
Per EMS, called out by pt's mother stating that the pt had fainted. On scene pt was alert and oriented x 4, complaining of RUQ and right flank pain. Hx of gallstones. Took advil without relief. Pt also took a laxative because she has been constipated for 2-3 days. When EMS palpated the pt's abdomen the pt vomited bile. Pt received 4mg  zofran IV and fentanyl. Pt rated her pain at a 10 before pain meds. o2 100% on RA. CBG 150, BP 141/90, HR 60 NSR on 12 Lead. Pt in NAD.

## 2014-10-14 NOTE — ED Provider Notes (Signed)
CSN: [id number]     Arrival date & time 10/14/14  2125 History   First MD Initiated Contact with Patient 10/14/14 2127     Chief Complaint  Patient presents with  . Abdominal Pain  . Nausea  . Near Syncope     (Consider location/radiation/quality/duration/timing/severity/associated sxs/prior Treatment) Patient is a 24 y.o. female presenting with abdominal pain and near-syncope. The history is provided by the patient. No language interpreter was used.  Abdominal Pain Pain location:  RUQ Pain quality: cramping and sharp   Pain radiates to:  R flank Pain severity:  Severe Associated symptoms: nausea and vomiting   Associated symptoms: no chest pain, no chills and no fever   Associated symptoms comment:  She returns to the ED with recurrent RUQ abdominal pain and nausea. No fever. She has been diagnosed with gall stones by ultrasound, the last study 07/26/14. She has been doing well since that time with only minor discomfort. She has not had the opportunity to call surgery for outpatient follow up for elective cholecystectomy. Near Syncope Associated symptoms include abdominal pain, nausea and vomiting. Pertinent negatives include no chest pain, chills or fever.    Past Medical History  Diagnosis Date  . Gall stones    History reviewed. No pertinent past surgical history. History reviewed. No pertinent family history. History  Substance Use Topics  . Smoking status: Current Some Day Smoker -- 0.25 packs/day  . Smokeless tobacco: Not on file  . Alcohol Use: No     Comment: occasional   OB History    No data available     Review of Systems  Constitutional: Negative for fever and chills.  Respiratory: Negative.   Cardiovascular: Positive for near-syncope. Negative for chest pain.  Gastrointestinal: Positive for nausea, vomiting and abdominal pain.  Genitourinary: Positive for flank pain.  Musculoskeletal: Negative.   Skin: Negative.   Neurological: Negative.        Allergies  Augmentin  Home Medications   Prior to Admission medications   Medication Sig Start Date End Date Taking? Authorizing Provider  bismuth subsalicylate (PEPTO BISMOL) 262 MG/15ML suspension Take 30 mLs by mouth every 6 (six) hours as needed for indigestion.    Historical Provider, MD  cetirizine (ZYRTEC) 10 MG tablet Take 1 tablet (10 mg total) by mouth daily. Patient taking differently: Take 10 mg by mouth daily as needed for allergies.  06/03/14   Charm Rings, MD  HYDROcodone-acetaminophen (NORCO/VICODIN) 5-325 MG per tablet Take 1-2 tablets by mouth every 6 (six) hours as needed for moderate pain or severe pain. 07/16/14   Marisa Severin, MD  HYDROcodone-homatropine [facility]) 5-1.5 MG/5ML syrup Take 5 mLs by mouth every 6 (six) hours as needed for cough. 06/03/14   Charm Rings, MD  hyoscyamine (LEVSIN, ANASPAZ) 0.125 MG tablet Take 1 tablet (0.125 mg total) by mouth every 4 (four) hours as needed for cramping. 07/16/14   Marisa Severin, MD  ibuprofen (ADVIL,MOTRIN) 200 MG tablet Take 400 mg by mouth every 6 (six) hours as needed for moderate pain.    Historical Provider, MD  ipratropium (ATROVENT) 0.06 % nasal spray Place 2 sprays into both nostrils 4 (four) times daily. 06/03/14   Charm Rings, MD  ondansetron (ZOFRAN) 4 MG tablet Take 1 tablet (4 mg total) by mouth every 6 (six) hours. As needed for nausea 07/16/14   Marisa Severin, MD   BP 140/85 mmHg  Pulse 67  Temp(Src) 97.4 F (36.3 C) (Oral)  Resp 16  SpO2  100%  LMP 09/22/2014 (Within Days) Physical Exam  Constitutional: She is oriented to person, place, and time. She appears well-developed and well-nourished.  Patient extremely uncomfortable appearing.  HENT:  Head: Normocephalic.  Neck: Normal range of motion. Neck supple.  Cardiovascular: Normal rate and regular rhythm.   Pulmonary/Chest: Effort normal and breath sounds normal. She has no wheezes. She has no rales.  Abdominal: Soft. Bowel sounds are normal. She exhibits  no distension. There is tenderness. There is no rebound and no guarding.  Musculoskeletal: Normal range of motion.  Neurological: She is alert and oriented to person, place, and time.  Skin: Skin is warm and dry. No rash noted.  Psychiatric: She has a normal mood and affect.    ED Course  Procedures (including critical care time) Labs Review Labs Reviewed  CBC WITH DIFFERENTIAL/PLATELET  COMPREHENSIVE METABOLIC PANEL  LIPASE, BLOOD  URINALYSIS, ROUTINE W REFLEX MICROSCOPIC (NOT AT [facility])  TROPONIN I  POC URINE PREG, ED   Results for orders placed or performed during the hospital encounter of 10/14/14  CBC with Differential  Result Value Ref Range   WBC 9.8 4.0 - 10.5 K/uL   RBC 4.78 3.87 - 5.11 MIL/uL   Hemoglobin 13.6 12.0 - 15.0 g/dL   HCT 40.9 81.1 - 91.4 %   MCV 84.7 78.0 - 100.0 fL   MCH 28.5 26.0 - 34.0 pg   MCHC 33.6 30.0 - 36.0 g/dL   RDW 78.2 95.6 - 21.3 %   Platelets 230 150 - 400 K/uL   Neutrophils Relative % 83 (H) 43 - 77 %   Neutro Abs 8.2 (H) 1.7 - 7.7 K/uL   Lymphocytes Relative 13 12 - 46 %   Lymphs Abs 1.2 0.7 - 4.0 K/uL   Monocytes Relative 3 3 - 12 %   Monocytes Absolute 0.3 0.1 - 1.0 K/uL   Eosinophils Relative 1 0 - 5 %   Eosinophils Absolute 0.1 0.0 - 0.7 K/uL   Basophils Relative 0 0 - 1 %   Basophils Absolute 0.0 0.0 - 0.1 K/uL  Comprehensive metabolic panel  Result Value Ref Range   Sodium 138 135 - 145 mmol/L   Potassium 4.1 3.5 - 5.1 mmol/L   Chloride 106 101 - 111 mmol/L   CO2 21 (L) 22 - 32 mmol/L   Glucose, Bld 130 (H) 65 - 99 mg/dL   BUN 10 6 - 20 mg/dL   Creatinine, Ser 0.86 0.44 - 1.00 mg/dL   Calcium 8.7 (L) 8.9 - 10.3 mg/dL   Total Protein 7.5 6.5 - 8.1 g/dL   Albumin 3.7 3.5 - 5.0 g/dL   AST 17 15 - 41 U/L   ALT 21 14 - 54 U/L   Alkaline Phosphatase 46 38 - 126 U/L   Total Bilirubin 0.7 0.3 - 1.2 mg/dL   GFR calc non Af Amer >60 >60 mL/min   GFR calc Af Amer >60 >60 mL/min   Anion gap 11 5 - 15  Lipase, blood  Result  Value Ref Range   Lipase 15 (L) 22 - 51 U/L  Troponin I  (only if pt is 23 y.o. or older and pain is above umbilicus)  Result Value Ref Range   Troponin I <0.03 <0.031 ng/mL     Imaging Review No results found.   EKG Interpretation None      MDM   Final diagnoses:  None    1. Biliary colic  Pain is resolved with IV dilaudid and nausea controlled  with Zofran. Waiting on repeat ultrasound for evaluation of cholecystitis. VSS. Will continue to observe.   Patient care transferred to [facility], PA-C, pending ultrasound abdomen.  Elpidio Anis, PA-C 10/15/14 0111  Richardean Canal, MD 10/16/14 [phone]

## 2014-10-15 LAB — URINE MICROSCOPIC-ADD ON

## 2014-10-15 LAB — URINALYSIS, ROUTINE W REFLEX MICROSCOPIC
Bilirubin Urine: NEGATIVE
GLUCOSE, UA: NEGATIVE mg/dL
Hgb urine dipstick: NEGATIVE
NITRITE: NEGATIVE
Protein, ur: NEGATIVE mg/dL
Specific Gravity, Urine: 1.031 — ABNORMAL HIGH (ref 1.005–1.030)
Urobilinogen, UA: 0.2 mg/dL (ref 0.0–1.0)
pH: 5.5 (ref 5.0–8.0)

## 2014-10-15 LAB — POC URINE PREG, ED: PREG TEST UR: NEGATIVE

## 2014-10-15 MED ORDER — TRAMADOL HCL 50 MG PO TABS: 50.0000 mg | ORAL_TABLET | Freq: Four times a day (QID) | ORAL | Status: DC | PRN

## 2014-10-15 MED ORDER — HYDROCODONE-ACETAMINOPHEN 5-325 MG PO TABS
1.0000 | ORAL_TABLET | Freq: Once | ORAL | Status: AC
Start: 2014-10-15 — End: 2014-10-15
  Administered 2014-10-15: 1 via ORAL
  Filled 2014-10-15: qty 1

## 2014-10-15 NOTE — ED Provider Notes (Signed)
Patient care acquired from Elpidio Anis, PA-C pending RUQ [place] results.   Results for orders placed or performed during the hospital encounter of 10/14/14  CBC with Differential  Result Value Ref Range   WBC 9.8 4.0 - 10.5 K/uL   RBC 4.78 3.87 - 5.11 MIL/uL   Hemoglobin 13.6 12.0 - 15.0 g/dL   HCT 16.1 09.6 - 04.5 %   MCV 84.7 78.0 - 100.0 fL   MCH 28.5 26.0 - 34.0 pg   MCHC 33.6 30.0 - 36.0 g/dL   RDW 40.9 81.1 - 91.4 %   Platelets 230 150 - 400 K/uL   Neutrophils Relative % 83 (H) 43 - 77 %   Neutro Abs 8.2 (H) 1.7 - 7.7 K/uL   Lymphocytes Relative 13 12 - 46 %   Lymphs Abs 1.2 0.7 - 4.0 K/uL   Monocytes Relative 3 3 - 12 %   Monocytes Absolute 0.3 0.1 - 1.0 K/uL   Eosinophils Relative 1 0 - 5 %   Eosinophils Absolute 0.1 0.0 - 0.7 K/uL   Basophils Relative 0 0 - 1 %   Basophils Absolute 0.0 0.0 - 0.1 K/uL  Comprehensive metabolic panel  Result Value Ref Range   Sodium 138 135 - 145 mmol/L   Potassium 4.1 3.5 - 5.1 mmol/L   Chloride 106 101 - 111 mmol/L   CO2 21 (L) 22 - 32 mmol/L   Glucose, Bld 130 (H) 65 - 99 mg/dL   BUN 10 6 - 20 mg/dL   Creatinine, Ser 7.82 0.44 - 1.00 mg/dL   Calcium 8.7 (L) 8.9 - 10.3 mg/dL   Total Protein 7.5 6.5 - 8.1 g/dL   Albumin 3.7 3.5 - 5.0 g/dL   AST 17 15 - 41 U/L   ALT 21 14 - 54 U/L   Alkaline Phosphatase 46 38 - 126 U/L   Total Bilirubin 0.7 0.3 - 1.2 mg/dL   GFR calc non Af Amer >60 >60 mL/min   GFR calc Af Amer >60 >60 mL/min   Anion gap 11 5 - 15  Lipase, blood  Result Value Ref Range   Lipase 15 (L) 22 - 51 U/L  Urinalysis, Routine w reflex microscopic (not at [facility])  Result Value Ref Range   Color, Urine YELLOW YELLOW   APPearance CLOUDY (A) CLEAR   Specific Gravity, Urine 1.031 (H) 1.005 - 1.030   pH 5.5 5.0 - 8.0   Glucose, UA NEGATIVE NEGATIVE mg/dL   Hgb urine dipstick NEGATIVE NEGATIVE   Bilirubin Urine NEGATIVE NEGATIVE   Ketones, ur >80 (A) NEGATIVE mg/dL   Protein, ur NEGATIVE NEGATIVE mg/dL   Urobilinogen, UA  0.2 0.0 - 1.0 mg/dL   Nitrite NEGATIVE NEGATIVE   Leukocytes, UA SMALL (A) NEGATIVE  Troponin I  (only if pt is 23 y.o. or older and pain is above umbilicus)  Result Value Ref Range   Troponin I <0.03 <0.031 ng/mL  Urine microscopic-add on  Result Value Ref Range   Squamous Epithelial / LPF FEW (A) RARE   WBC, UA 3-6 <3 WBC/hpf   Bacteria, UA FEW (A) RARE   Urine-Other MUCOUS PRESENT   POC Urine Pregnancy, ED  (If Pre-menopausal female)  not at [facility]  Result Value Ref Range   Preg Test, Ur NEGATIVE NEGATIVE   US Abdomen Complete  10/15/2014   CLINICAL DATA:  Right upper quadrant pain, nausea, vomiting, and diarrhea on off since November 2015. 50 lb weight loss.  EXAM: ULTRASOUND ABDOMEN COMPLETE  COMPARISON:  07/16/2014  FINDINGS: Gallbladder: Cholelithiasis with several gallstones in the dependent portion of the gallbladder. Largest stone measures about 1.3 cm diameter. No gallbladder wall thickening or sludge. Murphy's sign is negative.  Common bile duct: Diameter: 1.9 mm, normal  Liver: Circumscribed hyperechoic region demonstrated adjacent to the falciform ligament of the liver, probably representing focal fatty infiltration. No other focal liver lesions identified.  IVC: No abnormality visualized.  Pancreas: Visualized portion unremarkable.  Spleen: Size and appearance within normal limits.  Right Kidney: Length: 11.2 cm. Echogenicity within normal limits. No mass or hydronephrosis visualized.  Left Kidney: Length: 9.9 cm. Echogenicity within normal limits. No mass or hydronephrosis visualized.  Abdominal aorta: No aneurysm visualized.  Other findings: None.  IMPRESSION: Cholelithiasis without additional inflammatory changes. Circumscribed hyperechoic focus in the liver probably representing focal fatty infiltration. No acute changes identified.   Electronically Signed   By: Burman Nieves M.D.   On: 10/15/2014 01:55   2:59 AM On re-evaluation mild RUQ tenderness. No peritoneal signs.  Discussed need for outpatient surgery follow up.    1. Symptomatic cholelithiasis    I have reviewed nursing notes, vital signs, and all appropriate lab and imaging results if ordered as above. Return precautions discussed.    Abdomen is benign. Pain and symptoms managed prior to discharge. Able to tolerate PO intake. Patient is agreeable to plan. Patient is stable at time of discharge.  Francee Piccolo, PA-C 10/15/14 0600  Blake Divine, MD 10/16/14 [phone]

## 2014-10-15 NOTE — ED Notes (Signed)
This nurse escorted pt to bathroom, pt reports unable to void.  No urine obtained.  Provider made aware.

## 2014-10-15 NOTE — Discharge Instructions (Signed)
Please follow up with your primary care physician in 1-2 days. If you do not have one please call the [facility] number listed above. Please take pain medication and/or muscle relaxants as prescribed and as needed for pain. Please do not drive on narcotic pain medication or on muscle relaxants. Please follow up with [facility] to schedule a follow up appointment. Please read all discharge instructions and return precautions.   Cholelithiasis Cholelithiasis (also called gallstones) is a form of gallbladder disease in which gallstones form in your gallbladder. The gallbladder is an organ that stores bile made in the liver, which helps digest fats. Gallstones begin as small crystals and slowly grow into stones. Gallstone pain occurs when the gallbladder spasms and a gallstone is blocking the duct. Pain can also occur when a stone passes out of the duct.  RISK FACTORS  Being female.   Having multiple pregnancies. Health care providers sometimes advise removing diseased gallbladders before future pregnancies.   Being obese.  Eating a diet heavy in fried foods and fat.   Being older than 60 years and increasing age.   Prolonged use of medicines containing female hormones.   Having diabetes mellitus.   Rapidly losing weight.   Having a family history of gallstones (heredity).  SYMPTOMS  Nausea.   Vomiting.  Abdominal pain.   Yellowing of the skin (jaundice).   Sudden pain. It may persist from several minutes to several hours.  Fever.   Tenderness to the touch. In some cases, when gallstones do not move into the bile duct, people have no pain or symptoms. These are called "silent" gallstones.  TREATMENT Silent gallstones do not need treatment. In severe cases, emergency surgery may be required. Options for treatment include:  Surgery to remove the gallbladder. This is the most common treatment.  Medicines. These do not always work  and may take 6-12 months or more to work.  Shock wave treatment (extracorporeal biliary lithotripsy). In this treatment an ultrasound machine sends shock waves to the gallbladder to break gallstones into smaller pieces that can pass into the intestines or be dissolved by medicine. HOME CARE INSTRUCTIONS   Only take over-the-counter or prescription medicines for pain, discomfort, or fever as directed by your health care provider.   Follow a low-fat diet until seen again by your health care provider. Fat causes the gallbladder to contract, which can result in pain.   Follow up with your health care provider as directed. Attacks are almost always recurrent and surgery is usually required for permanent treatment.  SEEK IMMEDIATE MEDICAL CARE IF:   Your pain increases and is not controlled by medicines.   You have a fever or persistent symptoms for more than 2-3 days.   You have a fever and your symptoms suddenly get worse.   You have persistent nausea and vomiting.  MAKE SURE YOU:   Understand these instructions.  Will watch your condition.  Will get help right away if you are not doing well or get worse. Document Released: 04/10/2005 Document Revised: 12/15/2012 Document Reviewed: 10/06/2012 [facility] Patient Information 2015 [place], [place]. This information is not intended to replace advice given to you by your health care provider. Make sure you discuss any questions you have with your health care provider.

## 2014-10-24 ENCOUNTER — Other Ambulatory Visit: Payer: Self-pay | Admitting: Surgery

## 2014-11-07 ENCOUNTER — Encounter (HOSPITAL_COMMUNITY): Payer: Self-pay

## 2014-11-07 ENCOUNTER — Encounter (HOSPITAL_COMMUNITY)
Admission: RE | Admit: 2014-11-07 | Discharge: 2014-11-07 | Disposition: A | Discharge status: Home / Self Care | Payer: No Typology Code available for payment source | Source: Ambulatory Visit | Attending: Surgery | Admitting: Surgery

## 2014-11-07 DIAGNOSIS — K828 Other specified diseases of gallbladder: Secondary | ICD-10-CM | POA: Diagnosis not present

## 2014-11-07 DIAGNOSIS — K8010 Calculus of gallbladder with chronic cholecystitis without obstruction: Secondary | ICD-10-CM | POA: Diagnosis not present

## 2014-11-07 DIAGNOSIS — F1910 Other psychoactive substance abuse, uncomplicated: Secondary | ICD-10-CM | POA: Diagnosis not present

## 2014-11-07 DIAGNOSIS — Z79891 Long term (current) use of opiate analgesic: Secondary | ICD-10-CM | POA: Diagnosis not present

## 2014-11-07 DIAGNOSIS — Z79899 Other long term (current) drug therapy: Secondary | ICD-10-CM | POA: Diagnosis not present

## 2014-11-07 DIAGNOSIS — K8020 Calculus of gallbladder without cholecystitis without obstruction: Secondary | ICD-10-CM | POA: Diagnosis present

## 2014-11-07 DIAGNOSIS — F17200 Nicotine dependence, unspecified, uncomplicated: Secondary | ICD-10-CM | POA: Diagnosis not present

## 2014-11-07 HISTORY — DX: Headache: R51

## 2014-11-07 HISTORY — DX: Dysmenorrhea, unspecified: N94.6

## 2014-11-07 LAB — CBC
HEMATOCRIT: 40.2 % (ref 36.0–46.0)
HEMOGLOBIN: 13.2 g/dL (ref 12.0–15.0)
MCH: 28 pg (ref 26.0–34.0)
MCHC: 32.8 g/dL (ref 30.0–36.0)
MCV: 85.4 fL (ref 78.0–100.0)
Platelets: 250 10*3/uL (ref 150–400)
RBC: 4.71 MIL/uL (ref 3.87–5.11)
RDW: 12.6 % (ref 11.5–15.5)
WBC: 5.9 10*3/uL (ref 4.0–10.5)

## 2014-11-07 LAB — HCG, SERUM, QUALITATIVE: Preg, Serum: NEGATIVE

## 2014-11-07 MED ORDER — CIPROFLOXACIN IN D5W 400 MG/200ML IV SOLN
400.0000 mg | INTRAVENOUS | Status: AC
  Administered 2014-11-08: 400 mg via INTRAVENOUS
  Filled 2014-11-07: qty 200

## 2014-11-07 NOTE — Pre-Procedure Instructions (Signed)
Lisa FanningMarylou Palmer  11/07/2014      Your procedure is scheduled on Wednesday, November 08, 2014 at 8:30 AM.   [facility] Admitting Office at 6:30 AM.   Call this number if you have problems the morning of surgery: [phone]    Remember:  Do not eat food or drink liquids after midnight tonight.  Take these medicines the morning of surgery with A SIP OF WATER: Tramadol - if needed   Do not wear jewelry, make-up or nail polish.  Do not wear lotions, powders, or perfumes.  You may wear deodorant.  Do not shave 48 hours prior to surgery.    Do not bring valuables to the hospital.  [facility] any belongings or valuables.  Contacts, dentures or bridgework may not be worn into surgery.  Leave your suitcase in the car.  After surgery it may be brought to your room.  For patients admitted to the hospital, discharge time will be determined by your treatment team.  Patients discharged the day of surgery will not be allowed to drive home.   Special instructions:  [place] - Preparing for Surgery  Before surgery, you can play an important role.  Because skin is not sterile, your skin needs to be as free of germs as possible.  You can reduce the number of germs on you skin by washing with CHG (chlorahexidine gluconate) soap before surgery.  CHG is an antiseptic cleaner which kills germs and bonds with the skin to continue killing germs even after washing.  Please DO NOT use if you have an allergy to CHG or antibacterial soaps.  If your skin becomes reddened/irritated stop using the CHG and inform your nurse when you arrive at Short Stay.  Do not shave (including legs and underarms) for at least 48 hours prior to the first CHG shower.  You may shave your face.  Please follow these instructions carefully:   1.  Shower with CHG Soap the night before surgery and the                                morning of Surgery.  2.  If you choose to  wash your hair, wash your hair first as usual with your       normal shampoo.  3.  After you shampoo, rinse your hair and body thoroughly to remove the                      Shampoo.  4.  Use CHG as you would any other liquid soap.  You can apply chg directly       to the skin and wash gently with scrungie or a clean washcloth.  5.  Apply the CHG Soap to your body ONLY FROM THE NECK DOWN.        Do not use on open wounds or open sores.  Avoid contact with your eyes, ears, mouth and genitals (private parts).  Wash genitals (private parts) with your normal soap.  6.  Wash thoroughly, paying special attention to the area where your surgery        will be performed.  7.  Thoroughly rinse your body with warm water from the neck down.  8.  DO NOT shower/wash with your normal soap after using and rinsing off       the CHG Soap.  9.  Pat yourself dry with a clean towel.            10.  Wear clean pajamas.            11.  Place clean sheets on your bed the night of your first shower and do not        sleep with pets.  Day of Surgery  Do not apply any lotions the morning of surgery.  Please wear clean clothes to the hospital.    Please read over the following fact sheets that you were given. Pain Booklet, Coughing and Deep Breathing and Surgical Site Infection Prevention

## 2014-11-07 NOTE — H&P (Signed)
Lisa Palmer 10/24/2014 2:58 PM Location: [facility] Patient #: [id number] DOB: Jan 26, 1991 Married / Language: English / Race: Black or African American Female  History of Present Illness (Lisa Palmer A. Magnus Ivan MD; 10/24/2014 3:10 PM) Patient words: gallbladder.  The patient is a 24 year old female who presents for evaluation of gall stones. This is a pleasant female referred by the emergency Department for symptoms cholelithiasis. She has had 3 visits to the emergency department for right upper quadrant abdominal pain with nausea and vomiting over the past several months. She currently is not in pain today she reports she has right upper quadrant abdominal pain and nausea after most meals. She is actually afraid to eat because this discomfort. The pain can be sharp and severe. Bowel movements are normal. She is otherwise without complaints.   Other Problems Lisa Palmer, CMA; 10/24/2014 2:58 PM) Cholelithiasis  Past Surgical History Lisa Palmer, CMA; 10/24/2014 2:58 PM) No pertinent past surgical history  Diagnostic Studies History Lisa Palmer, CMA; 10/24/2014 2:58 PM) Colonoscopy never Mammogram never Pap Smear never  Allergies Lisa Palmer, CMA; 10/24/2014 2:59 PM) Augmentin *PENICILLINS* Rash.  Medication History Lisa Palmer, CMA; 10/24/2014 2:59 PM) TraMADol HCl (  Tablet, Oral) Active. ZyrTEC (  Tablet, Oral) Active. Medications Reconciled  Social History Lisa Palmer, [place]; 10/24/2014 2:58 PM) Alcohol use Occasional alcohol use. Caffeine use Coffee, Tea. Illicit drug use Prefer to discuss with provider. Tobacco use Current some day smoker.  Family History Lisa Palmer, [place]; 10/24/2014 2:58 PM) Diabetes Mellitus Father. Family history unknown First Degree Relatives Heart disease in female family member before age 28 Hypertension Mother.  Pregnancy / Birth History Lisa Palmer, [place]; 10/24/2014 2:58 PM) Age at menarche 13  years. Contraceptive History Oral contraceptives. Gravida 0 Para 0 Regular periods  Review of Systems Lisa Palmer CMA; 10/24/2014 2:58 PM) General Present- Appetite Loss, Night Sweats and Weight Loss. Not Present- Chills, Fatigue, Fever and Weight Gain. Skin Not Present- Change in Wart/Mole, Dryness, Hives, Jaundice, New Lesions, Non-Healing Wounds, Rash and Ulcer. HEENT Not Present- Earache, Hearing Loss, Hoarseness, Nose Bleed, Oral Ulcers, Ringing in the Ears, Seasonal Allergies, Sinus Pain, Sore Throat, Visual Disturbances, Wears glasses/contact lenses and Yellow Eyes. Respiratory Not Present- Bloody sputum, Chronic Cough, Difficulty Breathing, Snoring and Wheezing. Breast Not Present- Breast Mass, Breast Pain, Nipple Discharge and Skin Changes. Cardiovascular Not Present- Chest Pain, Difficulty Breathing Lying Down, Leg Cramps, Palpitations, Rapid Heart Rate, Shortness of Breath and Swelling of Extremities. Gastrointestinal Present- Abdominal Pain, Constipation, Excessive gas, Nausea and Vomiting. Not Present- Bloating, Bloody Stool, Change in Bowel Habits, Chronic diarrhea, Difficulty Swallowing, Gets full quickly at meals, Hemorrhoids, Indigestion and Rectal Pain. Female Genitourinary Not Present- Frequency, Nocturia, Painful Urination, Pelvic Pain and Urgency. Musculoskeletal Present- Back Pain and Muscle Weakness. Not Present- Joint Pain, Joint Stiffness, Muscle Pain and Swelling of Extremities. Neurological Present- Fainting. Not Present- Decreased Memory, Headaches, Numbness, Seizures, Tingling, Tremor, Trouble walking and Weakness. Psychiatric Present- Change in Sleep Pattern. Not Present- Anxiety, Bipolar, Depression, Fearful and Frequent crying. Endocrine Not Present- Cold Intolerance, Excessive Hunger, Hair Changes, Heat Intolerance, Hot flashes and New Diabetes. Hematology Not Present- Easy Bruising, Excessive bleeding, Gland problems, HIV and Persistent Infections.   Vitals  Lisa Palmer CMA; 10/24/2014 3:00 PM) 10/24/2014 2:59 PM Weight: 161 lb Height: 64in Body Surface Area: 1.82 m Body Mass Index: 27.64 kg/m Temp.: 98.48F(Oral)  Pulse: 63 (Regular)  BP: 128/60 (Sitting, Left Arm, Standard)    Physical Exam (Lisa Palmer A. Magnus Ivan MD; 10/24/2014 3:11 PM) General Mental  Status-Alert. General Appearance-Consistent with stated age. Hydration-Well hydrated. Voice-Normal.  Head and Neck Head-normocephalic, atraumatic with no lesions or palpable masses.  Eye Eyeball - Bilateral-Extraocular movements intact. Sclera/Conjunctiva - Bilateral-No scleral icterus.  Chest and Lung Exam Chest and lung exam reveals -quiet, even and easy respiratory effort with no use of accessory muscles and on auscultation, normal breath sounds, no adventitious sounds and normal vocal resonance. Inspection Chest Wall - Normal. Back - normal.  Cardiovascular Cardiovascular examination reveals -on palpation PMI is normal in location and amplitude, no palpable S3 or S4. Normal cardiac borders., normal heart sounds, regular rate and rhythm with no murmurs, carotid auscultation reveals no bruits and normal pedal pulses bilaterally.  Abdomen Inspection Inspection of the abdomen reveals - No Hernias. Skin - Scar - no surgical scars. Palpation/Percussion Palpation and Percussion of the abdomen reveal - Soft, Non Tender, No Rebound tenderness, No Rigidity (guarding) and No hepatosplenomegaly. Auscultation Auscultation of the abdomen reveals - Bowel sounds normal.  Neurologic Neurologic evaluation reveals -alert and oriented x 3 with no impairment of recent or remote memory. Mental Status-Normal.  Musculoskeletal Normal Exam - Left-Upper Extremity Strength Normal and Lower Extremity Strength Normal. Normal Exam - Right-Upper Extremity Strength Normal, Lower Extremity Weakness.    Assessment & Plan (Lisa Palmer A. Magnus IvanBlackman MD; 10/24/2014 3:12  PM) SYMPTOMATIC CHOLELITHIASIS (574.20  K80.20) Impression: I discussed the diagnosis with the patient in detail. Laparoscopic cholecystectomy is strongly recommended. She is very eager to proceed with surgery given her symptoms. I discussed the surgical procedure in detail and gave her literature regarding the surgery. I discussed the risk of surgery which includes but is not limited to bleeding, infection, vomiting, bile leak, injury to other structures, the need to convert to an open procedure, postoperative recovery, cardiopulmonary she is, DVT, etc. She understands and wishes to proceed. Current Plans  Started TraMADol HCl 50MG , 1 (one) Tablet every four hours, as needed, #30, 10/24/2014, No Refill. Pt Education - Pamphlet Given - Laparoscopic Gallbladder Surgery: discussed with patient and provided information.

## 2014-11-08 ENCOUNTER — Encounter (HOSPITAL_COMMUNITY): Payer: Self-pay | Admitting: *Deleted

## 2014-11-08 ENCOUNTER — Ambulatory Visit (HOSPITAL_COMMUNITY): Payer: No Typology Code available for payment source | Admitting: Certified Registered"

## 2014-11-08 ENCOUNTER — Encounter (HOSPITAL_COMMUNITY)
Admission: RE | Disposition: A | Discharge status: Home / Self Care | Payer: Self-pay | Source: Ambulatory Visit | Attending: Surgery

## 2014-11-08 ENCOUNTER — Ambulatory Visit (HOSPITAL_COMMUNITY)
Admission: RE | Admit: 2014-11-08 | Discharge: 2014-11-08 | Disposition: A | Discharge status: Home / Self Care | Payer: No Typology Code available for payment source | Source: Ambulatory Visit | Attending: Surgery | Admitting: Surgery

## 2014-11-08 DIAGNOSIS — K8010 Calculus of gallbladder with chronic cholecystitis without obstruction: Secondary | ICD-10-CM | POA: Insufficient documentation

## 2014-11-08 DIAGNOSIS — F1910 Other psychoactive substance abuse, uncomplicated: Secondary | ICD-10-CM | POA: Insufficient documentation

## 2014-11-08 DIAGNOSIS — K828 Other specified diseases of gallbladder: Secondary | ICD-10-CM | POA: Insufficient documentation

## 2014-11-08 DIAGNOSIS — Z79899 Other long term (current) drug therapy: Secondary | ICD-10-CM | POA: Insufficient documentation

## 2014-11-08 DIAGNOSIS — F17200 Nicotine dependence, unspecified, uncomplicated: Secondary | ICD-10-CM | POA: Insufficient documentation

## 2014-11-08 DIAGNOSIS — Z79891 Long term (current) use of opiate analgesic: Secondary | ICD-10-CM | POA: Insufficient documentation

## 2014-11-08 HISTORY — PX: CHOLECYSTECTOMY: SHX55

## 2014-11-08 SURGERY — LAPAROSCOPIC CHOLECYSTECTOMY
Anesthesia: General | Site: Abdomen

## 2014-11-08 MED ORDER — BUPIVACAINE-EPINEPHRINE 0.25% -1:200000 IJ SOLN
INTRAMUSCULAR | Status: DC | PRN
  Administered 2014-11-08: 20 mL

## 2014-11-08 MED ORDER — HYDROMORPHONE HCL 1 MG/ML IJ SOLN
INTRAMUSCULAR | Status: AC
  Filled 2014-11-08: qty 1

## 2014-11-08 MED ORDER — SODIUM CHLORIDE 0.9 % IR SOLN
Status: DC | PRN
  Administered 2014-11-08: 1000 mL

## 2014-11-08 MED ORDER — BUPIVACAINE-EPINEPHRINE (PF) 0.25% -1:200000 IJ SOLN
INTRAMUSCULAR | Status: AC
  Filled 2014-11-08: qty 30

## 2014-11-08 MED ORDER — SODIUM CHLORIDE 0.9 % IJ SOLN: 3.0000 mL | INTRAMUSCULAR | Status: DC | PRN

## 2014-11-08 MED ORDER — ACETAMINOPHEN 325 MG PO TABS: 650.0000 mg | ORAL_TABLET | ORAL | Status: DC | PRN

## 2014-11-08 MED ORDER — MORPHINE SULFATE 2 MG/ML IJ SOLN: 1.0000 mg | INTRAMUSCULAR | Status: DC | PRN

## 2014-11-08 MED ORDER — MIDAZOLAM HCL 2 MG/2ML IJ SOLN
INTRAMUSCULAR | Status: AC
  Filled 2014-11-08: qty 2

## 2014-11-08 MED ORDER — 0.9 % SODIUM CHLORIDE (POUR BTL) OPTIME
TOPICAL | Status: DC | PRN
  Administered 2014-11-08: 1000 mL

## 2014-11-08 MED ORDER — SODIUM CHLORIDE 0.9 % IV SOLN: 250.0000 mL | INTRAVENOUS | Status: DC | PRN

## 2014-11-08 MED ORDER — OXYCODONE HCL 5 MG PO TABS
5.0000 mg | ORAL_TABLET | ORAL | Status: DC | PRN
  Administered 2014-11-08: 10 mg via ORAL
  Filled 2014-11-08: qty 2

## 2014-11-08 MED ORDER — ONDANSETRON HCL 4 MG/2ML IJ SOLN
INTRAMUSCULAR | Status: DC | PRN
  Administered 2014-11-08: 4 mg via INTRAVENOUS

## 2014-11-08 MED ORDER — FENTANYL CITRATE (PF) 250 MCG/5ML IJ SOLN
INTRAMUSCULAR | Status: AC
  Filled 2014-11-08: qty 5

## 2014-11-08 MED ORDER — LACTATED RINGERS IV SOLN
INTRAVENOUS | Status: DC | PRN
  Administered 2014-11-08 (×2): via INTRAVENOUS

## 2014-11-08 MED ORDER — OXYCODONE HCL 5 MG PO TABS
ORAL_TABLET | ORAL | Status: AC
  Administered 2014-11-08: 10 mg via ORAL
  Filled 2014-11-08: qty 2

## 2014-11-08 MED ORDER — PROPOFOL 10 MG/ML IV BOLUS
INTRAVENOUS | Status: AC
  Filled 2014-11-08: qty 20

## 2014-11-08 MED ORDER — ONDANSETRON HCL 4 MG/2ML IJ SOLN
INTRAMUSCULAR | Status: AC
  Filled 2014-11-08: qty 2

## 2014-11-08 MED ORDER — MIDAZOLAM HCL 5 MG/5ML IJ SOLN
INTRAMUSCULAR | Status: DC | PRN
  Administered 2014-11-08: 2 mg via INTRAVENOUS

## 2014-11-08 MED ORDER — FENTANYL CITRATE (PF) 100 MCG/2ML IJ SOLN
INTRAMUSCULAR | Status: DC | PRN
  Administered 2014-11-08: 25 ug via INTRAVENOUS
  Administered 2014-11-08: 100 ug via INTRAVENOUS
  Administered 2014-11-08: 25 ug via INTRAVENOUS

## 2014-11-08 MED ORDER — SUCCINYLCHOLINE CHLORIDE 20 MG/ML IJ SOLN
INTRAMUSCULAR | Status: DC | PRN
  Administered 2014-11-08: 80 mg via INTRAVENOUS

## 2014-11-08 MED ORDER — ONDANSETRON HCL 4 MG/2ML IJ SOLN
4.0000 mg | Freq: Once | INTRAMUSCULAR | Status: AC | PRN
  Administered 2014-11-08: 4 mg via INTRAVENOUS

## 2014-11-08 MED ORDER — SODIUM CHLORIDE 0.9 % IJ SOLN: 3.0000 mL | Freq: Two times a day (BID) | INTRAMUSCULAR | Status: DC

## 2014-11-08 MED ORDER — PROPOFOL 10 MG/ML IV BOLUS
INTRAVENOUS | Status: DC | PRN
  Administered 2014-11-08: 160 mg via INTRAVENOUS

## 2014-11-08 MED ORDER — DEXAMETHASONE SODIUM PHOSPHATE 4 MG/ML IJ SOLN
INTRAMUSCULAR | Status: DC | PRN
  Administered 2014-11-08: 8 mg via INTRAVENOUS

## 2014-11-08 MED ORDER — ACETAMINOPHEN 650 MG RE SUPP: 650.0000 mg | RECTAL | Status: DC | PRN

## 2014-11-08 MED ORDER — TRAMADOL HCL 50 MG PO TABS: 50.0000 mg | ORAL_TABLET | Freq: Four times a day (QID) | ORAL | Status: DC | PRN

## 2014-11-08 MED ORDER — HYDROMORPHONE HCL 1 MG/ML IJ SOLN
0.5000 mg | INTRAMUSCULAR | Status: AC | PRN
  Administered 2014-11-08 (×4): 0.5 mg via INTRAVENOUS

## 2014-11-08 MED ORDER — SUCCINYLCHOLINE CHLORIDE 20 MG/ML IJ SOLN
INTRAMUSCULAR | Status: AC
  Filled 2014-11-08: qty 1

## 2014-11-08 MED ORDER — HYDROMORPHONE HCL 1 MG/ML IJ SOLN
INTRAMUSCULAR | Status: AC
  Administered 2014-11-08: 0.5 mg via INTRAVENOUS
  Filled 2014-11-08: qty 1

## 2014-11-08 MED ORDER — ROCURONIUM BROMIDE 50 MG/5ML IV SOLN
INTRAVENOUS | Status: AC
  Filled 2014-11-08: qty 1

## 2014-11-08 MED ORDER — LIDOCAINE HCL (CARDIAC) 20 MG/ML IV SOLN
INTRAVENOUS | Status: DC | PRN
  Administered 2014-11-08: 60 mg via INTRAVENOUS

## 2014-11-08 SURGICAL SUPPLY — 29 items
APPLIER CLIP 5 13 M/L LIGAMAX5 (MISCELLANEOUS) ×3
CANISTER SUCTION 2500CC (MISCELLANEOUS) ×3 IMPLANT
CHLORAPREP W/TINT 26ML (MISCELLANEOUS) ×3 IMPLANT
CLIP APPLIE 5 13 M/L LIGAMAX5 (MISCELLANEOUS) ×1 IMPLANT
COVER SURGICAL LIGHT HANDLE (MISCELLANEOUS) ×3 IMPLANT
ELECT REM PT RETURN 9FT ADLT (ELECTROSURGICAL) ×3
ELECTRODE REM PT RTRN 9FT ADLT (ELECTROSURGICAL) ×1 IMPLANT
GLOVE SURG SIGNA 7.5 PF LTX (GLOVE) ×3 IMPLANT
GOWN STRL REUS W/ TWL LRG LVL3 (GOWN DISPOSABLE) ×2 IMPLANT
GOWN STRL REUS W/ TWL XL LVL3 (GOWN DISPOSABLE) ×1 IMPLANT
GOWN STRL REUS W/TWL LRG LVL3 (GOWN DISPOSABLE) ×4
GOWN STRL REUS W/TWL XL LVL3 (GOWN DISPOSABLE) ×2
KIT BASIN OR (CUSTOM PROCEDURE TRAY) ×3 IMPLANT
KIT ROOM TURNOVER OR (KITS) ×3 IMPLANT
LIQUID BAND (GAUZE/BANDAGES/DRESSINGS) ×3 IMPLANT
NS IRRIG 1000ML POUR BTL (IV SOLUTION) ×3 IMPLANT
PAD ARMBOARD 7.5X6 YLW CONV (MISCELLANEOUS) ×3 IMPLANT
POUCH SPECIMEN RETRIEVAL 10MM (ENDOMECHANICALS) ×3 IMPLANT
SCISSORS LAP 5X35 DISP (ENDOMECHANICALS) ×3 IMPLANT
SET IRRIG TUBING LAPAROSCOPIC (IRRIGATION / IRRIGATOR) ×3 IMPLANT
SLEEVE ENDOPATH XCEL 5M (ENDOMECHANICALS) ×6 IMPLANT
SPECIMEN JAR SMALL (MISCELLANEOUS) ×3 IMPLANT
SUT MON AB 4-0 PC3 18 (SUTURE) ×3 IMPLANT
TOWEL OR 17X24 6PK STRL BLUE (TOWEL DISPOSABLE) ×3 IMPLANT
TOWEL OR 17X26 10 PK STRL BLUE (TOWEL DISPOSABLE) ×3 IMPLANT
TRAY LAPAROSCOPIC MC (CUSTOM PROCEDURE TRAY) ×3 IMPLANT
TROCAR XCEL BLUNT TIP 100MML (ENDOMECHANICALS) ×3 IMPLANT
TROCAR XCEL NON-BLD 5MMX100MML (ENDOMECHANICALS) ×3 IMPLANT
TUBING INSUFFLATION (TUBING) ×3 IMPLANT

## 2014-11-08 NOTE — Discharge Instructions (Signed)
[facility] ______[facility] LAPAROSCOPIC SURGERY: POST OP INSTRUCTIONS Always review your discharge instruction sheet given to you by the facility where your surgery was performed. IF YOU HAVE DISABILITY OR FAMILY LEAVE FORMS, YOU MUST BRING THEM TO THE OFFICE FOR PROCESSING.   DO NOT GIVE THEM TO YOUR DOCTOR.  1. A prescription for pain medication may be given to you upon discharge.  Take your pain medication as prescribed, if needed.  If narcotic pain medicine is not needed, then you may take acetaminophen (Tylenol) or ibuprofen (Advil) as needed. 2. Take your usually prescribed medications unless otherwise directed. 3. If you need a refill on your pain medication, please contact your pharmacy.  They will contact our office to request authorization. Prescriptions will not be filled after 5pm or on week-ends. 4. You should follow a light diet the first few days after arrival home, such as soup and crackers, etc.  Be sure to include lots of fluids daily. 5. Most patients will experience some swelling and bruising in the area of the incisions.  Ice packs will help.  Swelling and bruising can take several days to resolve.  6. It is common to experience some constipation if taking pain medication after surgery.  Increasing fluid intake and taking a stool softener (such as Colace) will usually help or prevent this problem from occurring.  A mild laxative (Milk of Magnesia or Miralax) should be taken according to package instructions if there are no bowel movements after 48 hours. 7. Unless discharge instructions indicate otherwise, you may remove your bandages 24-48 hours after surgery, and you may shower at that time.  You may have steri-strips (small skin tapes) in place directly over the incision.  These strips should be left on the skin for 7-10 days.  If your surgeon used skin glue on the incision, you may shower in 24 hours.  The glue will flake off over the next 2-3 weeks.  Any sutures or  staples will be removed at the office during your follow-up visit. 8. ACTIVITIES:  You may resume regular (light) daily activities beginning the next day--such as daily self-care, walking, climbing stairs--gradually increasing activities as tolerated.  You may have sexual intercourse when it is comfortable.  Refrain from any heavy lifting or straining until approved by your doctor. a. You may drive when you are no longer taking prescription pain medication, you can comfortably wear a seatbelt, and you can safely maneuver your car and apply brakes. b. RETURN TO WORK:  __________________________________________________________ 9. You should see your doctor in the office for a follow-up appointment approximately 2-3 weeks after your surgery.  Make sure that you call for this appointment within a day or two after you arrive home to insure a convenient appointment time. 10. OTHER INSTRUCTIONS: __may shower tomorrow 11. Ice pack and ibuprofen also for pain 12. ________________________________________________________________________________________________________________________ __________________________________________________________________________________________________________________________ WHEN TO CALL YOUR DOCTOR: 1. Fever over 101.0 2. Inability to urinate 3. Continued bleeding from incision. 4. Increased pain, redness, or drainage from the incision. 5. Increasing abdominal pain  The clinic staff is available to answer your questions during regular business hours.  Please dont hesitate to call and ask to speak to one of the nurses for clinical concerns.  If you have a medical emergency, go to the nearest emergency room or call 911.  A [facility] from [facility] is always on call at the hospital. [address], [address], [place], [place]  [id number] ? [address] [address], [place], [place]  [address] [phone] ? [phone] ? FAX [phone] Web site:  [email or web address]

## 2014-11-08 NOTE — Transfer of Care (Signed)
Immediate Anesthesia Transfer of Care Note  Patient: Lisa Palmer  Procedure(s) Performed: Procedure(s): LAPAROSCOPIC CHOLECYSTECTOMY (N/A)  Patient Location: PACU  Anesthesia Type:General  Level of Consciousness: awake, alert  and oriented  Airway & Oxygen Therapy: Patient Spontanous Breathing  Post-op Assessment: Report given to RN and Post -op Vital signs reviewed and stable  Post vital signs: Reviewed and stable  Last Vitals:  Filed Vitals:   11/08/14 0617  BP: 107/72  Pulse: 64  Temp: 36.8 C  Resp: 18    Complications: No apparent anesthesia complications

## 2014-11-08 NOTE — Anesthesia Postprocedure Evaluation (Signed)
  Anesthesia Post-op Note  Patient: Lisa Palmer  Procedure(s) Performed: Procedure(s): LAPAROSCOPIC CHOLECYSTECTOMY (N/A)  Patient Location: PACU  Anesthesia Type:General  Level of Consciousness: awake, alert , oriented and patient cooperative  Airway and Oxygen Therapy: Patient Spontanous Breathing  Post-op Pain: mild  Post-op Assessment: Post-op Vital signs reviewed, Patient's Cardiovascular Status Stable, Respiratory Function Stable, Patent Airway, No signs of Nausea or vomiting and Pain level controlled              Post-op Vital Signs: stable  Last Vitals:  Filed Vitals:   11/08/14 0930  BP: 123/80  Pulse: 80  Temp:   Resp: 21    Complications: No apparent anesthesia complications

## 2014-11-08 NOTE — Anesthesia Preprocedure Evaluation (Signed)
Anesthesia Evaluation  Patient identified by MRN, date of birth, ID band Patient awake    Reviewed: Allergy & Precautions, NPO status , Patient's Chart, lab work & pertinent test results  Airway Mallampati: I       Dental   Pulmonary Current Smoker,    Pulmonary exam normal       Cardiovascular Normal cardiovascular exam    Neuro/Psych  Headaches,    GI/Hepatic (+)     substance abuse  marijuana use,   Endo/Other    Renal/GU      Musculoskeletal   Abdominal   Peds  Hematology   Anesthesia Other Findings   Reproductive/Obstetrics                             Anesthesia Physical Anesthesia Plan  ASA: I  Anesthesia Plan: General   Post-op Pain Management:    Induction: Intravenous  Airway Management Planned: Oral ETT  Additional Equipment:   Intra-op Plan:   Post-operative Plan: Extubation in OR  Informed Consent: I have reviewed the patients History and Physical, chart, labs and discussed the procedure including the risks, benefits and alternatives for the proposed anesthesia with the patient or authorized representative who has indicated his/her understanding and acceptance.     Plan Discussed with: CRNA, Anesthesiologist and Surgeon  Anesthesia Plan Comments:         Anesthesia Quick Evaluation

## 2014-11-08 NOTE — Anesthesia Procedure Notes (Signed)
Procedure Name: Intubation Date/Time: 11/08/2014 8:28 AM Performed by: Arlice ColtMANESS, Field Staniszewski B Pre-anesthesia Checklist: Patient identified, Emergency Drugs available, Suction available, Patient being monitored and Timeout performed Patient Re-evaluated:Patient Re-evaluated prior to inductionOxygen Delivery Method: Circle system utilized Preoxygenation: Pre-oxygenation with 100% oxygen Intubation Type: IV induction and Rapid sequence Laryngoscope Size: Mac and 3 Grade View: Grade I Tube type: Oral Tube size: 7.5 mm Number of attempts: 1 Airway Equipment and Method: Stylet Placement Confirmation: ETT inserted through vocal cords under direct vision,  positive ETCO2 and breath sounds checked- equal and bilateral Secured at: 21 cm Tube secured with: Tape Dental Injury: Teeth and Oropharynx as per pre-operative assessment

## 2014-11-08 NOTE — Op Note (Signed)
Laparoscopic Cholecystectomy Procedure Note  Indications: This patient presents with symptomatic gallbladder disease and will undergo laparoscopic cholecystectomy.  Pre-operative Diagnosis: Calculus of gallbladder without mention of cholecystitis or obstruction  Post-operative Diagnosis: Same  Surgeon: Abigail Miyamoto A   Assistants: 0  Anesthesia: General endotracheal anesthesia  ASA Class: 1  Procedure Details  The patient was seen again in the Holding Room. The risks, benefits, complications, treatment options, and expected outcomes were discussed with the patient. The possibilities of reaction to medication, pulmonary aspiration, perforation of viscus, bleeding, recurrent infection, finding a normal gallbladder, the need for additional procedures, failure to diagnose a condition, the possible need to convert to an open procedure, and creating a complication requiring transfusion or operation were discussed with the patient. The likelihood of improving the patient's symptoms with return to their baseline status is good.  The patient and/or family concurred with the proposed plan, giving informed consent. The site of surgery properly noted. The patient was taken to Operating Room, identified as Lisa Palmer and the procedure verified as Laparoscopic Cholecystectomy with Intraoperative Cholangiogram. A Time Out was held and the above information confirmed.  Prior to the induction of general anesthesia, antibiotic prophylaxis was administered. General endotracheal anesthesia was then administered and tolerated well. After the induction, the abdomen was prepped with Chloraprep and draped in sterile fashion. The patient was positioned in the supine position.  Local anesthetic agent was injected into the skin near the umbilicus and an incision made. We dissected down to the abdominal fascia with blunt dissection.  The fascia was incised vertically and we entered the peritoneal cavity bluntly.  A  pursestring suture of 0-Vicryl was placed around the fascial opening.  The Hasson cannula was inserted and secured with the stay suture.  Pneumoperitoneum was then created with CO2 and tolerated well without any adverse changes in the patient's vital signs. A 5-mm port was placed in the subxiphoid position.  Two 5-mm ports were placed in the right upper quadrant. All skin incisions were infiltrated with a local anesthetic agent before making the incision and placing the trocars.   We positioned the patient in reverse Trendelenburg, tilted slightly to the patient's left.  The gallbladder was identified, the fundus grasped and retracted cephalad. Adhesions were lysed bluntly and with the electrocautery where indicated, taking care not to injure any adjacent organs or viscus. The infundibulum was grasped and retracted laterally, exposing the peritoneum overlying the triangle of Calot. This was then divided and exposed in a blunt fashion. The cystic duct was clearly identified and bluntly dissected circumferentially. A critical view of the cystic duct and cystic artery was obtained.  The cystic duct was then ligated with clips and divided. The cystic artery was, dissected free, ligated with clips and divided as well.   The gallbladder was dissected from the liver bed in retrograde fashion with the electrocautery. The gallbladder was removed and placed in an Endocatch sac. The liver bed was irrigated and inspected. Hemostasis was achieved with the electrocautery. Copious irrigation was utilized and was repeatedly aspirated until clear.  The gallbladder and Endocatch sac were then removed through the umbilical port site.  The pursestring suture was used to close the umbilical fascia.    We again inspected the right upper quadrant for hemostasis.  Pneumoperitoneum was released as we removed the trocars.  4-0 Monocryl was used to close the skin.   Skin glue was then applied. The patient was then extubated and brought  to the recovery room in stable condition.  Instrument, sponge, and needle counts were correct at closure and at the conclusion of the case.   Findings: Cholecystitis with Cholelithiasis  Estimated Blood Loss: Minimal         Drains: 0         Specimens: Gallbladder           Complications: None; patient tolerated the procedure well.         Disposition: PACU - hemodynamically stable.         Condition: stable

## 2014-11-08 NOTE — Interval H&P Note (Signed)
History and Physical Interval Note:no change in H and P  11/08/2014 6:52 AM  Lisa Palmer  has presented today for surgery, with the diagnosis of Symtomatic Cholelithiasis  The various methods of treatment have been discussed with the patient and family. After consideration of risks, benefits and other options for treatment, the patient has consented to  Procedure(s): LAPAROSCOPIC CHOLECYSTECTOMY (N/A) as a surgical intervention .  The patient's history has been reviewed, patient examined, no change in status, stable for surgery.  I have reviewed the patient's chart and labs.  Questions were answered to the patient's satisfaction.     Ebon Ketchum A

## 2014-11-09 ENCOUNTER — Encounter (HOSPITAL_COMMUNITY): Payer: Self-pay | Admitting: Surgery

## 2016-02-08 IMAGING — US US ABDOMEN COMPLETE
1 series · 13 of 25 positions shown · non-contrast
Comparison: 07/16/2014

CLINICAL DATA: Right upper quadrant pain, nausea, vomiting, and
diarrhea on off since [DATE] lb weight loss.

EXAM:
ULTRASOUND ABDOMEN COMPLETE

[Series 1: us abdomen complete · 0.24mm/px · 13 of 83 slices shown]
[im 1/83]
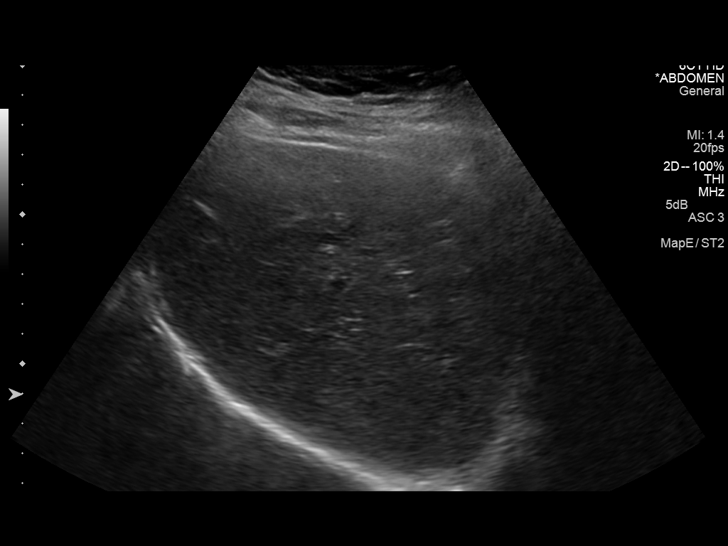
[im 7/83]
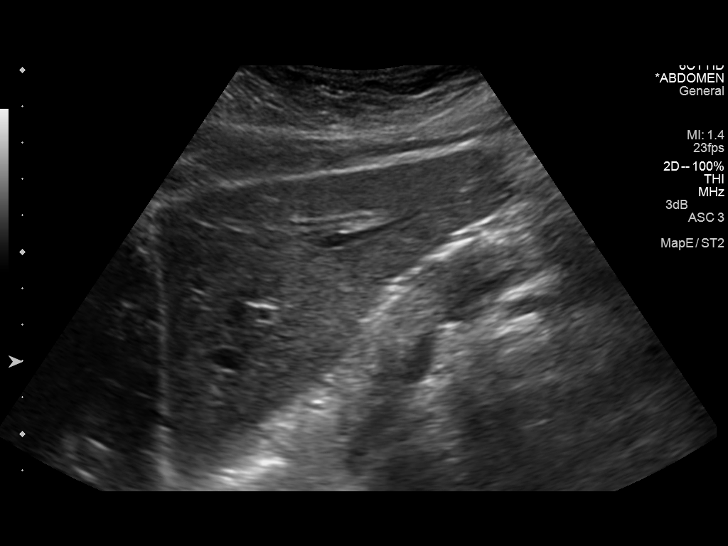
[im 14/83]
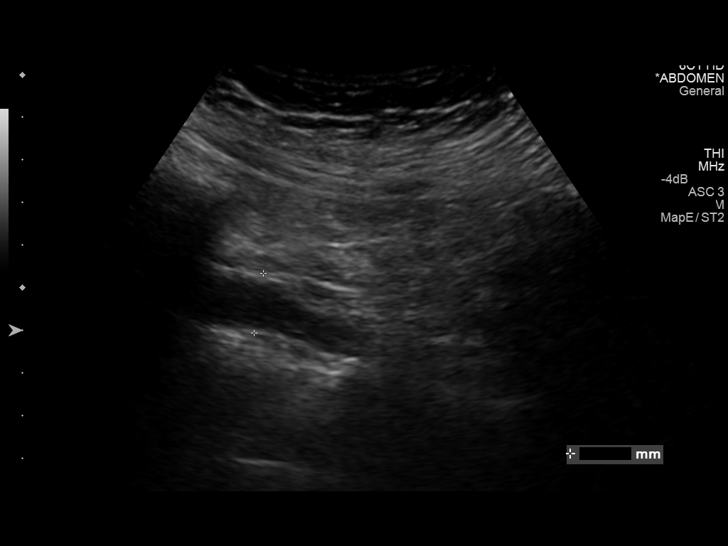
[im 21/83]
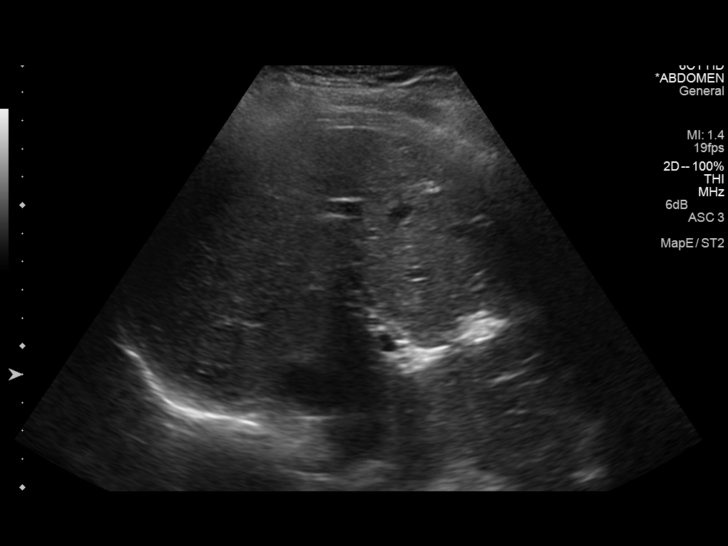
[im 28/83]
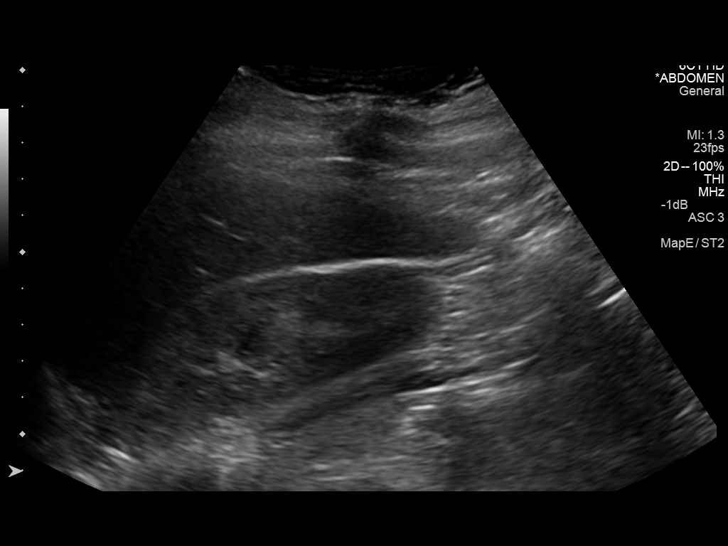
[im 35/83]
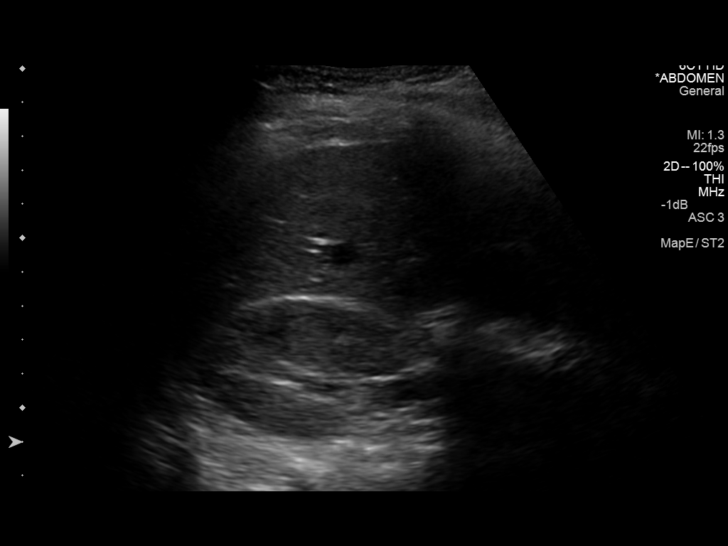
[im 42/83]
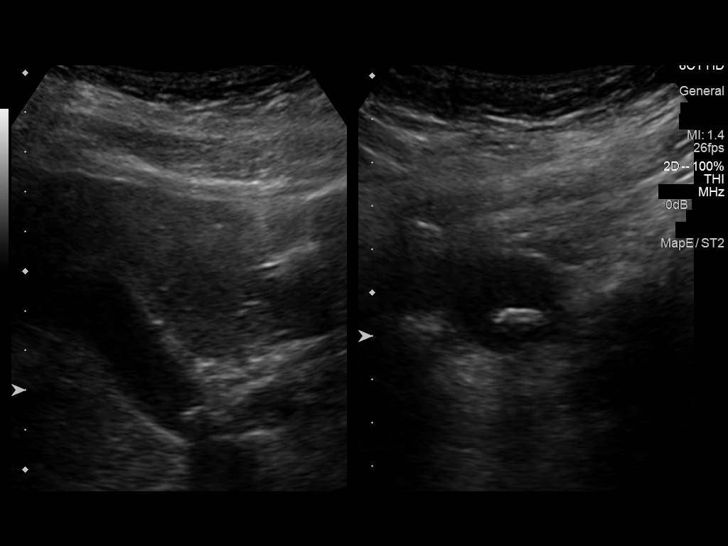
[im 48/83]
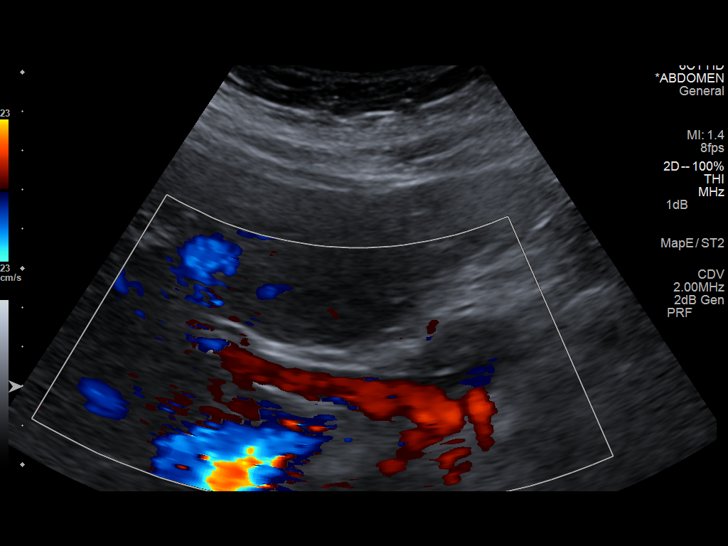
[im 55/83]
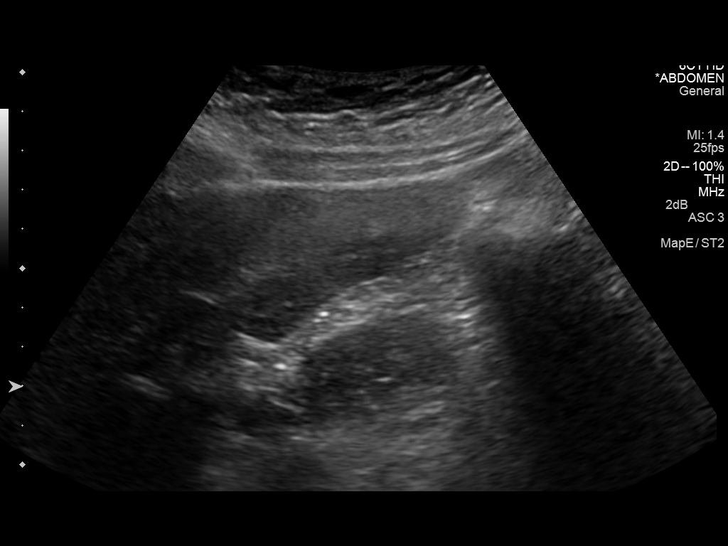
[im 62/83]
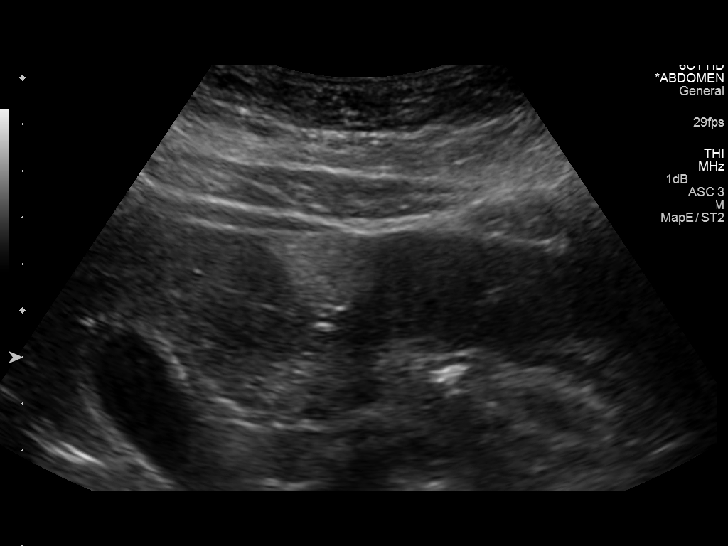
[im 69/83]
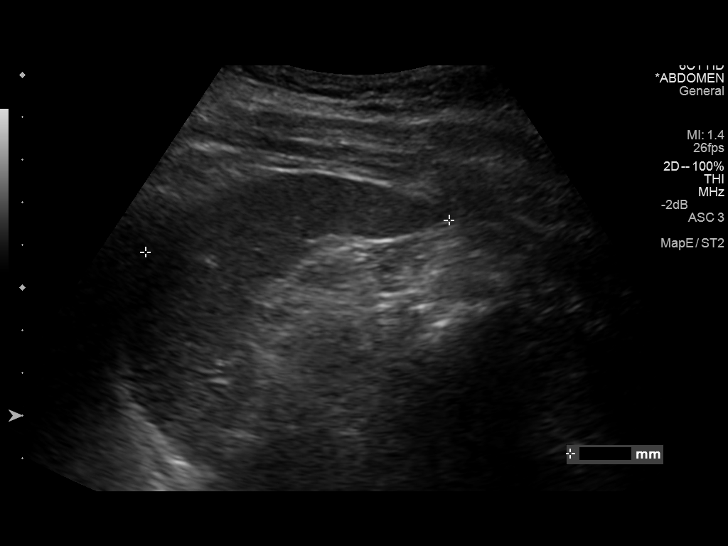
[im 76/83]
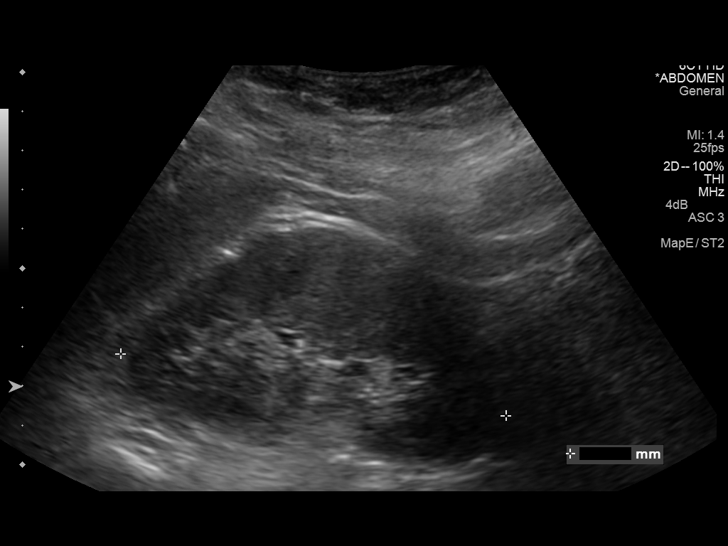
[im 83/83]
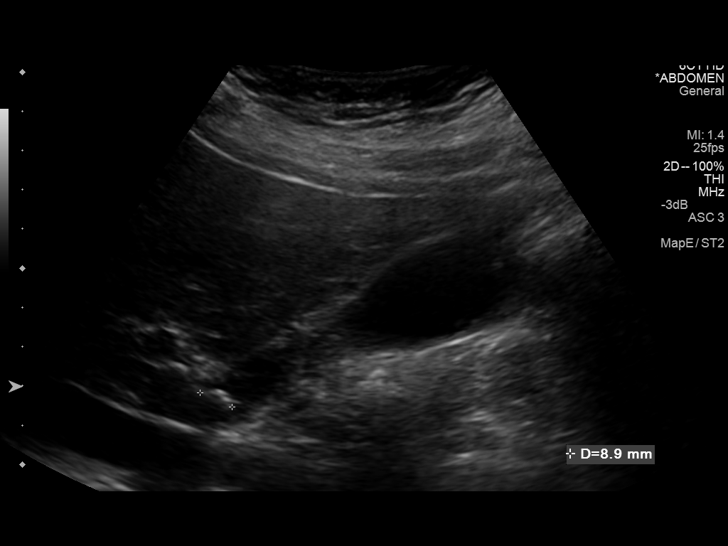

[13 of 25 positions shown; findings below may reference images not displayed]

FINDINGS: Gallbladder: Cholelithiasis with several gallstones in the dependent
portion of the gallbladder. Largest stone measures about 1.3 cm
diameter. No gallbladder wall thickening or sludge. Murphy's sign is
negative.

Common bile duct: Diameter: 1.9 mm, normal

Liver: Circumscribed hyperechoic region demonstrated adjacent to the
falciform ligament of the liver, probably representing focal fatty
infiltration. No other focal liver lesions identified.

IVC: No abnormality visualized.

Pancreas: Visualized portion unremarkable.

Spleen: Size and appearance within normal limits.

Right Kidney: Length: 11.2 cm. Echogenicity within normal limits. No
mass or hydronephrosis visualized.

Left Kidney: Length: 9.9 cm. Echogenicity within normal limits. No
mass or hydronephrosis visualized.

Abdominal aorta: No aneurysm visualized.

Other findings: None.
IMPRESSION: Cholelithiasis without additional inflammatory changes.
Circumscribed hyperechoic focus in the liver probably representing
focal fatty infiltration. No acute changes identified.

## 2016-06-28 IMAGING — US US ABDOMEN LIMITED
1 series · 14 of 25 positions shown · non-contrast
Comparison: None.

CLINICAL DATA: RIGHT upper quadrant pain. Symptoms began this
evening after eating ice cream.

EXAM:
US ABDOMEN LIMITED - RIGHT UPPER QUADRANT

[Series 1: us abdomen limited · 0.27mm/px · 14 of 35 slices shown]
[im 1/35]
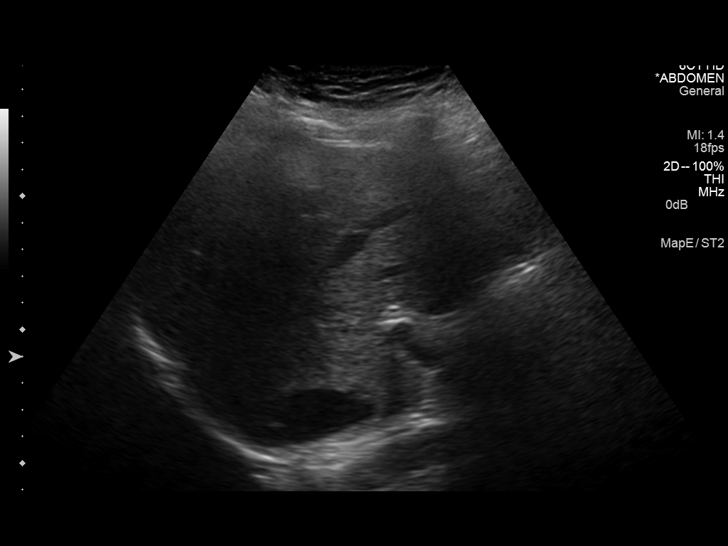
[im 3/35]
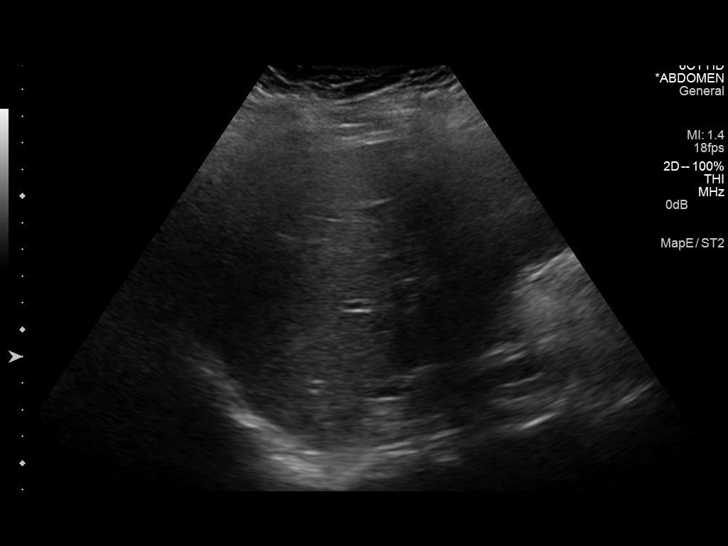
[im 6/35]
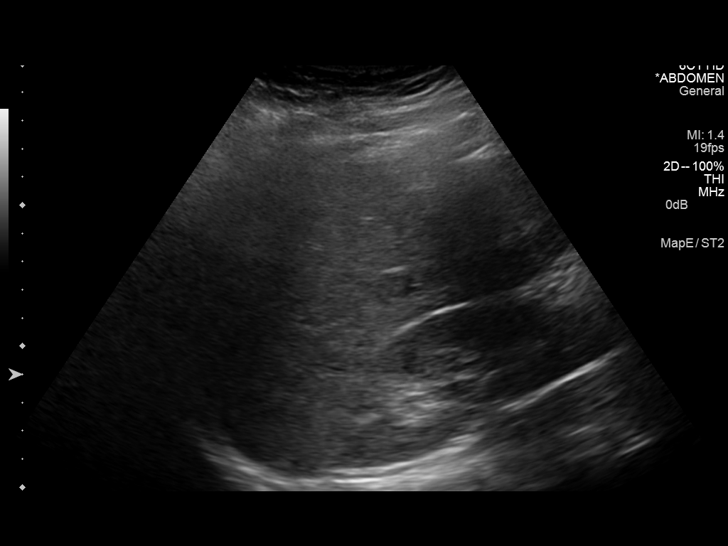
[im 9/35]
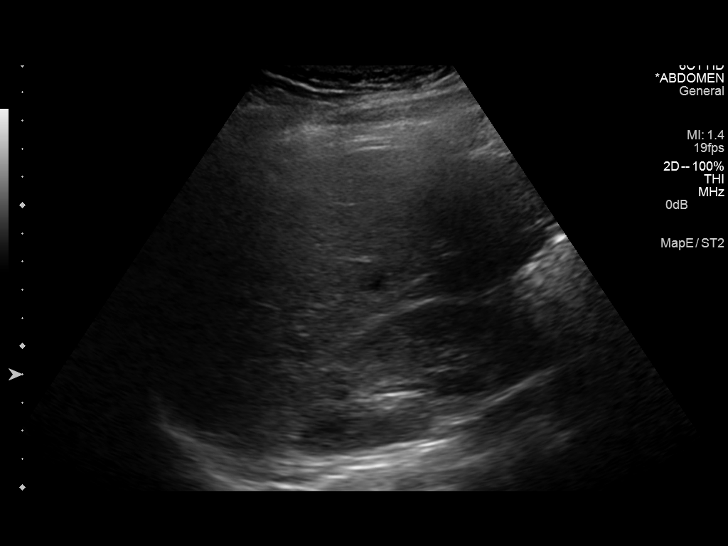
[im 12/35]
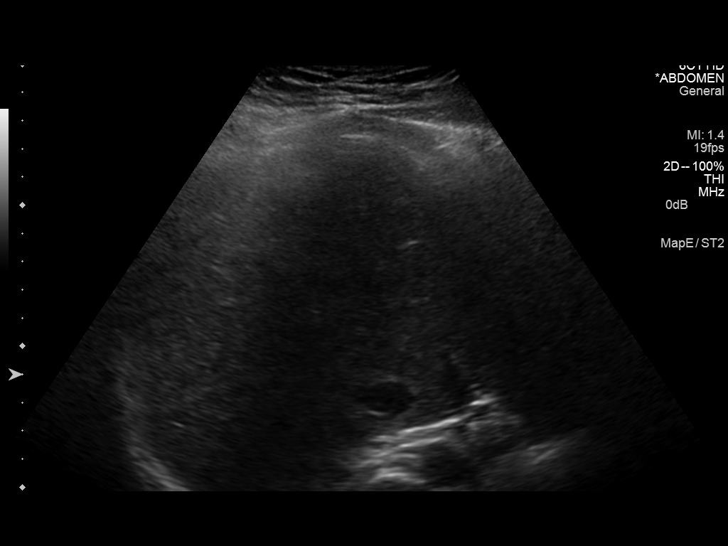
[im 13/35]
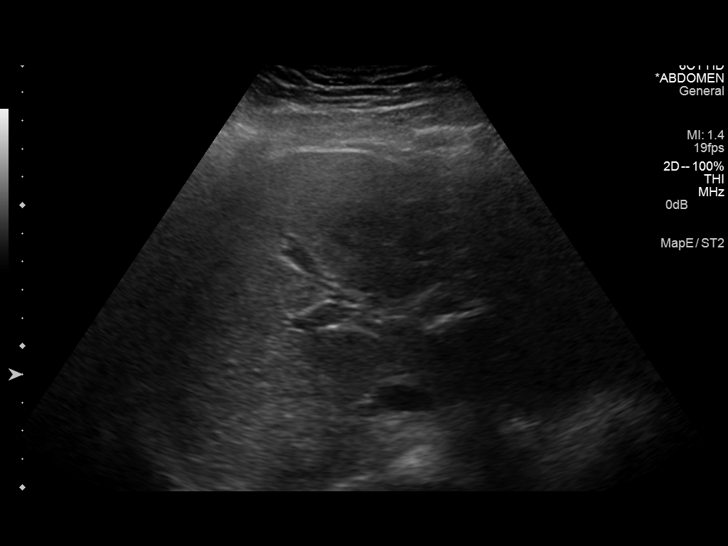
[im 16/35]
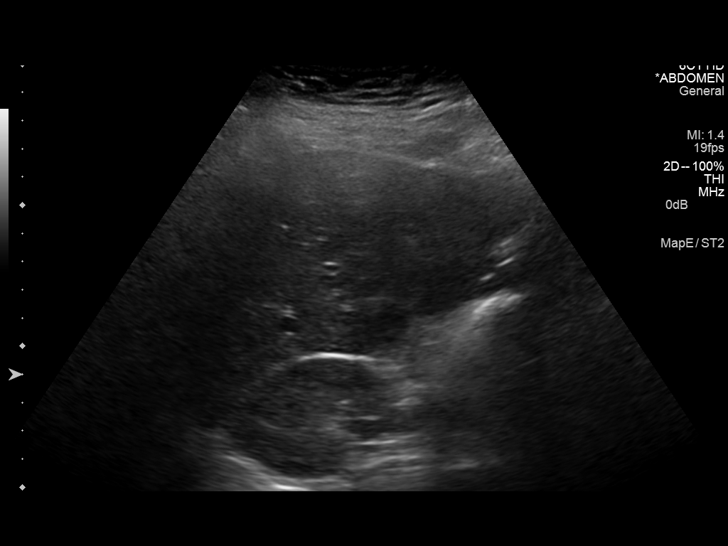
[im 19/35]
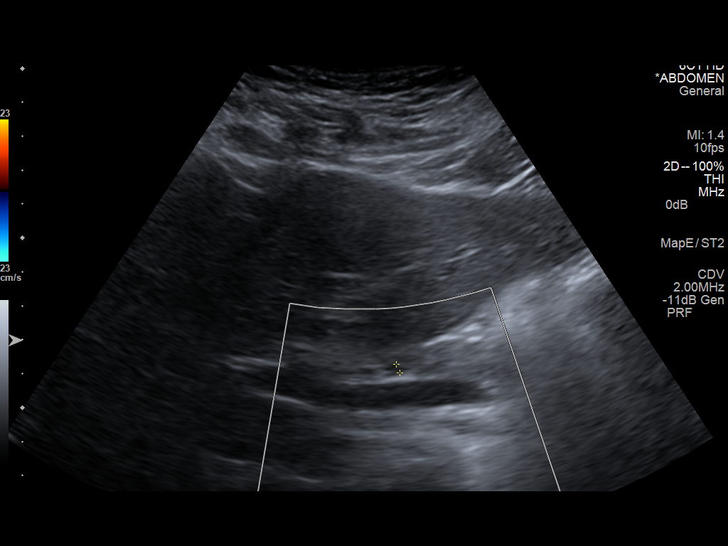
[im 22/35]
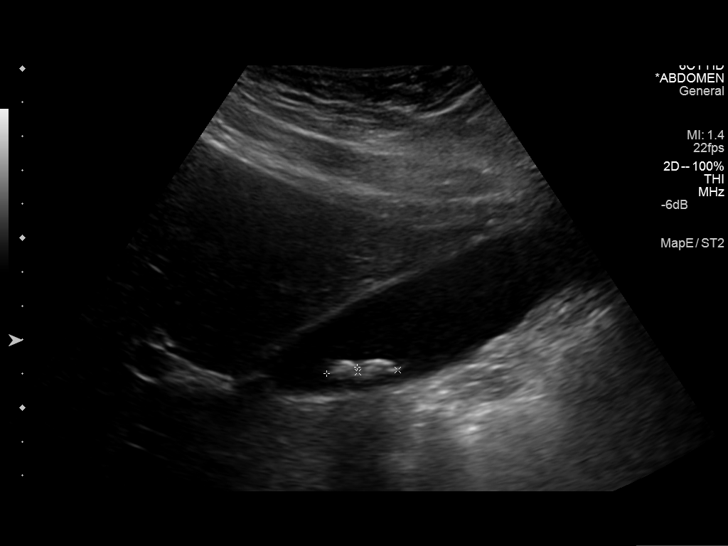
[im 23/35]
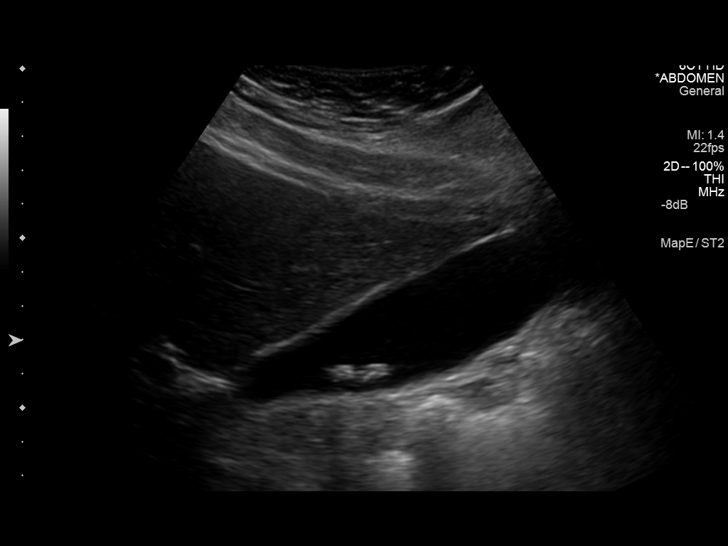
[im 26/35]
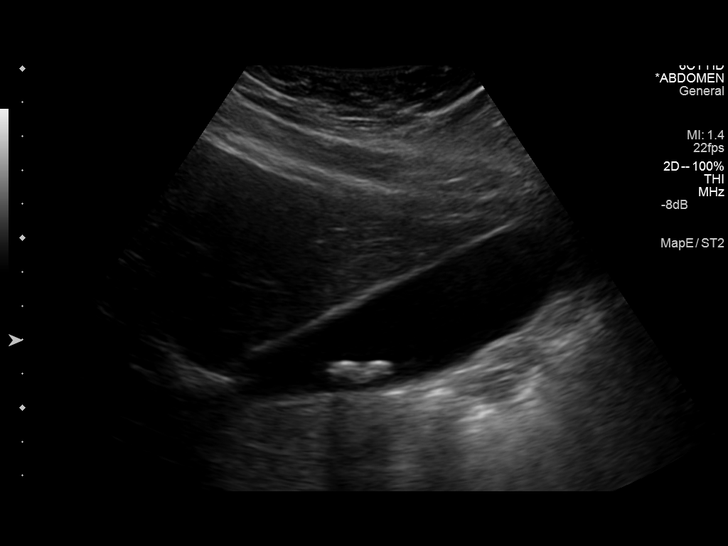
[im 29/35]
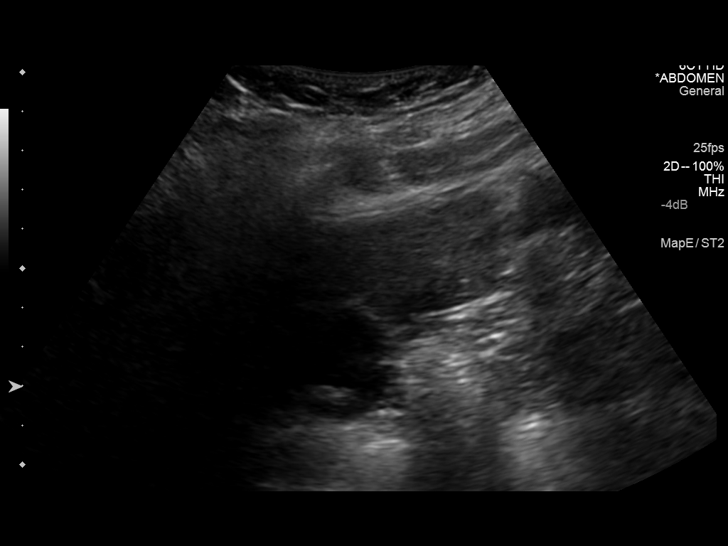
[im 32/35]
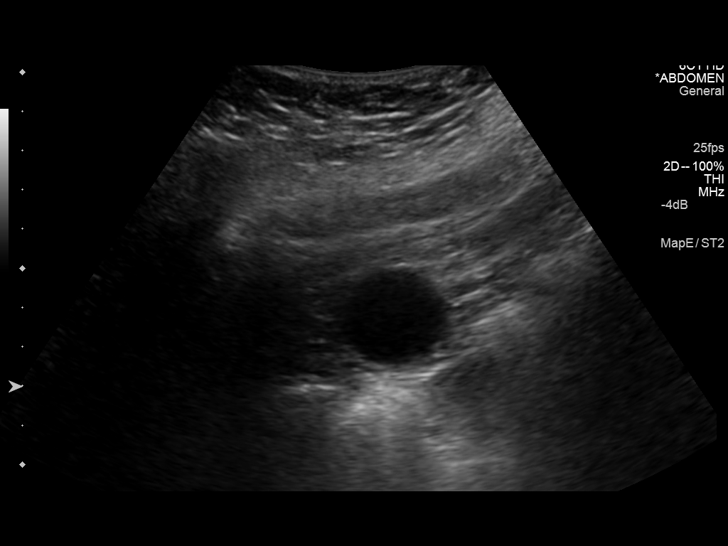
[im 35/35]
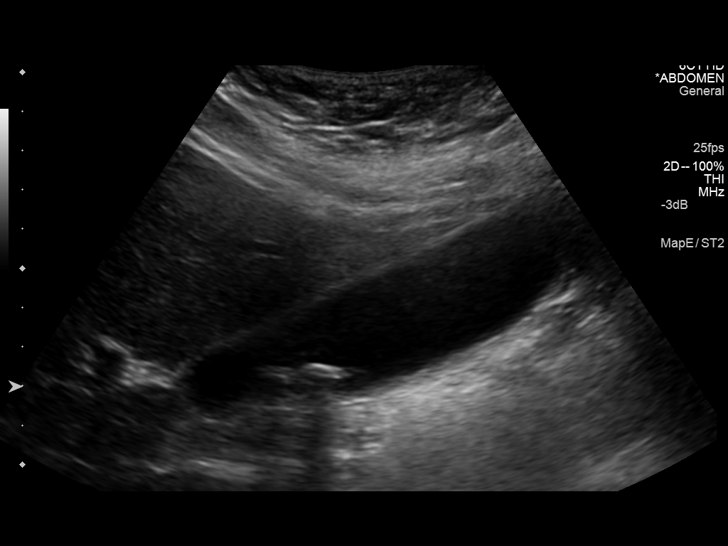

[14 of 25 positions shown; findings below may reference images not displayed]

FINDINGS: Gallbladder:

Two echogenic gallstones with acoustic shadowing measure up to
cm. No gallbladder wall thickening, pericholecystic fluid. No
sonographic Murphy's sign elicited.

Common bile duct:

Diameter: 3 mm

Liver:

No focal lesion identified. Within normal limits in parenchymal
echogenicity.
IMPRESSION: Cholelithiasis without sonographic findings of acute cholecystitis.

  By: Lesaurora Bonilla Almendars

## 2019-02-16 DIAGNOSIS — Z01411 Encounter for gynecological examination (general) (routine) with abnormal findings: Secondary | ICD-10-CM | POA: Diagnosis not present

## 2019-02-16 LAB — CBC AND DIFFERENTIAL
HCT: 41 (ref 36–46)
Hemoglobin: 13.9 (ref 12.0–16.0)
Platelets: 271 (ref 150–399)
WBC: 4.6

## 2019-02-16 LAB — HM HEPATITIS C SCREENING LAB: HM Hepatitis Screen: NEGATIVE

## 2019-02-16 LAB — HM HIV SCREENING LAB: HM HIV Screening: NEGATIVE

## 2019-02-16 LAB — HM PAP SMEAR

## 2019-02-16 LAB — HEMOGLOBIN A1C: Hemoglobin A1C: 7.2

## 2019-02-16 LAB — TSH: TSH: 0.71 (ref <=5.90)

## 2019-02-27 DIAGNOSIS — E282 Polycystic ovarian syndrome: Secondary | ICD-10-CM

## 2019-02-27 HISTORY — DX: Polycystic ovarian syndrome: E28.2

## 2019-03-04 DIAGNOSIS — Z6838 Body mass index (BMI) 38.0-38.9, adult: Secondary | ICD-10-CM | POA: Diagnosis not present

## 2019-03-04 DIAGNOSIS — N939 Abnormal uterine and vaginal bleeding, unspecified: Secondary | ICD-10-CM | POA: Diagnosis not present

## 2019-03-10 ENCOUNTER — Encounter: Payer: Self-pay | Admitting: Family Medicine

## 2019-03-10 ENCOUNTER — Ambulatory Visit (INDEPENDENT_AMBULATORY_CARE_PROVIDER_SITE_OTHER): Payer: No Typology Code available for payment source | Admitting: Family Medicine

## 2019-03-10 ENCOUNTER — Other Ambulatory Visit: Payer: Self-pay

## 2019-03-10 VITALS — BP 132/80 | HR 91 | Temp 97.7°F | Ht 64.0 in | Wt 228.4 lb

## 2019-03-10 DIAGNOSIS — Z23 Encounter for immunization: Secondary | ICD-10-CM | POA: Diagnosis not present

## 2019-03-10 DIAGNOSIS — E119 Type 2 diabetes mellitus without complications: Secondary | ICD-10-CM

## 2019-03-10 DIAGNOSIS — E282 Polycystic ovarian syndrome: Secondary | ICD-10-CM

## 2019-03-10 DIAGNOSIS — N946 Dysmenorrhea, unspecified: Secondary | ICD-10-CM

## 2019-03-10 DIAGNOSIS — Z6839 Body mass index (BMI) 39.0-39.9, adult: Secondary | ICD-10-CM

## 2019-03-10 DIAGNOSIS — G43909 Migraine, unspecified, not intractable, without status migrainosus: Secondary | ICD-10-CM | POA: Insufficient documentation

## 2019-03-10 LAB — COMPREHENSIVE METABOLIC PANEL
ALT: 51 U/L — ABNORMAL HIGH (ref 0–35)
AST: 35 U/L (ref 0–37)
Albumin: 4.2 g/dL (ref 3.5–5.2)
Alkaline Phosphatase: 37 U/L — ABNORMAL LOW (ref 39–117)
BUN: 9 mg/dL (ref 6–23)
CO2: 25 mEq/L (ref 19–32)
Calcium: 8.5 mg/dL (ref 8.4–10.5)
Chloride: 107 mEq/L (ref 96–112)
Creatinine, Ser: 0.76 mg/dL (ref 0.40–1.20)
GFR: 109.78 mL/min (ref >60.00)
Glucose, Bld: 109 mg/dL — ABNORMAL HIGH (ref 70–99)
Potassium: 3.9 mEq/L (ref 3.5–5.1)
Sodium: 140 mEq/L (ref 135–145)
Total Bilirubin: 0.4 mg/dL (ref 0.2–1.2)
Total Protein: 7.1 g/dL (ref 6.0–8.3)

## 2019-03-10 LAB — LIPID PANEL
Cholesterol: 141 mg/dL (ref 0–200)
HDL: 32.8 mg/dL — ABNORMAL LOW (ref >39.00)
LDL Cholesterol: 91 mg/dL (ref 0–99)
NonHDL: 108.21
Total CHOL/HDL Ratio: 4
Triglycerides: 85 mg/dL (ref 0.0–149.0)
VLDL: 17 mg/dL (ref 0.0–40.0)

## 2019-03-10 LAB — MICROALBUMIN / CREATININE URINE RATIO
Creatinine,U: 237.7 mg/dL
Microalb Creat Ratio: 1.5 mg/g (ref 0.0–30.0)
Microalb, Ur: 3.6 mg/dL — ABNORMAL HIGH (ref 0.0–1.9)

## 2019-03-10 LAB — GC/CHLAMYDIA PROBE AMP
Chlamydia, Nuc. Acid Amp: NEGATIVE
Gonococcus, Nuc. Acid Amp: NEGATIVE

## 2019-03-10 MED ORDER — METFORMIN HCL 500 MG PO TABS: 500.0000 mg | ORAL_TABLET | Freq: Two times a day (BID) | ORAL | 3 refills | Status: DC

## 2019-03-10 NOTE — Progress Notes (Signed)
Subjective  CC:  Chief Complaint  Patient presents with  . Discuss elevated blood sugars    A1c 7.2 by GYN    HPI: Lisa Palmer is a 28 y.o. female who presents to [facility] [place] today to establish care with me as a new patient.   She has the following concerns or needs:  28 yo single female who went to see GYN due to prolonged AUB: I reviewed the notes. dxd with likely PCOS and new onset diabetes. Thus, here to establish care and further evaluation. Hasn't had regular health care in several years.  New onset DM: pt reports feels well w/o sxs of hyperglycemia she is overweight, reports that she does not eat well.  She drinks multiple sodas daily.  She would be interested in eating better.  She is a strong family history of diabetes in her father and both grandmothers.  She was restarted on oral contraception for PCOS menstrual irregularities.  Health maintenance: Pap smear which was done and was normal.  Due for flu vaccination.  Never has had pneumonia vaccinations.  Social: Originally from outside [place] area, moved to [place] for college.  Now works for the [employer].  Loves her job.  Not currently in a relationship.  No children.  She does have a dog.  Non-smoker.  Assessment  1. New onset type 2 diabetes mellitus ([place])   2. PCOS (polycystic ovarian syndrome)   3. Dysmenorrhea   4. Class 2 severe obesity due to excess calories with serious comorbidity and body mass index (BMI) of 39.0 to 39.9 in adult [facility])      Plan   New onset diabetes: Start education.  Start nutritional diabetic referral.  Start Metformin low-dose.  Close follow-up, discussed dietary changes including stopping sweetened beverages.  Work on mild weight loss: Goal 3 to 6 pounds in the next month.  Flu shot today.  Further education upon follow-up.  Check urine microalbuminuria today.  Check lipid panel.  Normal blood pressure now.  Consider  ACE inhibitor.  GYN to manage PCOS and dysmenorrhea, continue oral contraceptive  Follow up:  No follow-ups on file. Orders Placed This Encounter  Procedures  . CMP  . Lipids  . Urine MAC  . Referral to Nutrition and Diabetes Services - new onset,   No orders of the defined types were placed in this encounter.    No flowsheet data found.  We updated and reviewed the patient's past history in detail and it is documented below.  Patient Active Problem List   Diagnosis Date Noted  . Migraine 03/10/2019  . New onset type 2 diabetes mellitus ([place]) 03/10/2019  . Dysmenorrhea   . PCOS (polycystic ovarian syndrome) 02/2019  . Class 2 severe obesity due to excess calories with serious comorbidity and body mass index (BMI) of 39.0 to 39.9 in adult [facility]) 08/30/2008   Health Maintenance  Topic Date Due  . HEMOGLOBIN A1C  01/05/1992  . PNEUMOCOCCAL POLYSACCHARIDE VACCINE AGE 20-64 HIGH RISK  05/08/1993  . FOOT EXAM  05/08/2001  . OPHTHALMOLOGY EXAM  05/08/2001  . URINE MICROALBUMIN  05/08/2001  . HIV Screening  05/08/2006  . PAP SMEAR-Modifier  05/08/2012  . INFLUENZA VACCINE  11/27/2018  . TETANUS/TDAP  12/08/2019  . PAP-Cervical Cytology Screening  02/15/2022   Immunization History  Administered Date(s) Administered  . DTaP, 5 pertussis antigens 08/19/1991, 11/22/1991, 02/06/1992, 05/04/1992, 11/06/1995  . HiB (PRP-OMP) 02/06/1992, 05/04/1992, 01/31/1993  .  MMR Sep 11, 1991, 11/06/1995  . OPV 08/19/1991, 11/22/1991, 01/31/1993, 11/06/1995  . Tdap 12/07/2009   Current Meds  Medication Sig  . BLISOVI 24 FE 1-20 MG-MCG(24) tablet Take 1 tablet by mouth daily.  Marland Kitchen ibuprofen (ADVIL) 200 MG tablet Take 400-600 mg by mouth every 6 (six) hours as needed.    Allergies: Patient is allergic to augmentin [amoxicillin-pot clavulanate]. Past Medical History Patient  has a past medical history of Dysmenorrhea, Gall stones, and PCOS (polycystic ovarian syndrome) (02/2019). Past Surgical  History Patient  has a past surgical history that includes Tear duct probing and Cholecystectomy (N/A, 11/08/2014). Family History: Patient family history includes Diabetes type II in her father; Hypertension in her mother; Raynaud syndrome in her mother. Social History:  Patient  reports that she has been smoking. She has been smoking about 0.25 packs per day. She has never used smokeless tobacco. She reports current alcohol use. She reports current drug use. Drug: Marijuana.  Review of Systems: Constitutional: negative for fever or malaise Ophthalmic: negative for photophobia, double vision or loss of vision Cardiovascular: negative for chest pain, dyspnea on exertion, or new LE swelling Respiratory: negative for SOB or persistent cough Gastrointestinal: negative for abdominal pain, change in bowel habits or melena Genitourinary: negative for dysuria or gross hematuria Musculoskeletal: negative for new gait disturbance or muscular weakness Integumentary: negative for new or persistent rashes Neurological: negative for TIA or stroke symptoms Psychiatric: negative for SI or delusions Allergic/Immunologic: negative for hives  Patient Care Team    Relationship Specialty Notifications Start End  Leamon Arnt, MD PCP - General Family Medicine  03/10/19     Objective  Vitals: BP (!) 146/90 (BP Location: Left Arm, Patient Position: Sitting, Cuff Size: Large)   Pulse 91   Temp 97.7 F (36.5 C) (Temporal)   Ht _0  (1.626 m)   Wt 228 lb 6.1 oz (103.6 kg)   SpO2 96%   BMI 39.20 kg/m  General:  Well developed, well nourished, no acute distress, central obesity present Psych:  Alert and oriented,normal mood and affect HEENT:  Normocephalic, atraumatic, non-icteric sclera, PERRL, oropharynx is without mass or exudate, supple neck without adenopathy, mass or thyromegaly Cardiovascular:  RRR without gallop, rub or murmur, nondisplaced PMI Respiratory:  Good breath sounds bilaterally, CTAB  with normal respiratory effort Gastrointestinal: normal bowel sounds, soft, non-tender, no noted masses. No HSM MSK: no deformities, contusions. Joints are without erythema or swelling Skin:  Warm, no rashes or suspicious lesions noted Neurologic:    Mental status is normal. Gross motor and sensory exams are normal. Normal gait   Commons side effects, risks, benefits, and alternatives for medications and treatment plan prescribed today were discussed, and the patient expressed understanding of the given instructions. Patient is instructed to call or message via MyChart if he/she has any questions or concerns regarding our treatment plan. No barriers to understanding were identified. We discussed Red Flag symptoms and signs in detail. Patient expressed understanding regarding what to do in case of urgent or emergency type symptoms.   Medication list was reconciled, printed and provided to the patient in AVS. Patient instructions and summary information was reviewed with the patient as documented in the AVS. This note was prepared with assistance of Dragon voice recognition software. Occasional wrong-word or sound-a-like substitutions may have occurred due to the inherent limitations of voice recognition software

## 2019-03-10 NOTE — Patient Instructions (Addendum)
Please return in 4-6 weeks for recheck diabetes.  Today you were given your flu vaccination.   Please sign up for mychart.  I will release your lab results to you on your MyChart account with further instructions. Please reply with any questions.   I have placed a referral for diabetic education and nutrition. We will call you for an appointment. Please let me know if you don't hear from us within 2 weeks.   STOP drinking sweetened beverages/sodas! This will help you immensely.  Start the metformin once a day for 3-7 days, and then increase to twice a day if tolerated.   It was a pleasure meeting you today! Thank you for choosing us to meet your healthcare needs! I truly look forward to working with you. If you have any questions or concerns, please send me a message via Mychart or call the office at [phone].   Diabetes Mellitus and Nutrition, Adult When you have diabetes (diabetes mellitus), it is very important to have healthy eating habits because your blood sugar (glucose) levels are greatly affected by what you eat and drink. Eating healthy foods in the appropriate amounts, at about the same times every day, can help you:  Control your blood glucose.  Lower your risk of heart disease.  Improve your blood pressure.  Reach or maintain a healthy weight. Every person with diabetes is different, and each person has different needs for a meal plan. Your health care provider may recommend that you work with a diet and nutrition specialist (dietitian) to make a meal plan that is best for you. Your meal plan may vary depending on factors such as:  The calories you need.  The medicines you take.  Your weight.  Your blood glucose, blood pressure, and cholesterol levels.  Your activity level.  Other health conditions you have, such as heart or kidney disease. How do carbohydrates affect me? Carbohydrates, also called carbs, affect your blood glucose level more than any other type  of food. Eating carbs naturally raises the amount of glucose in your blood. Carb counting is a method for keeping track of how many carbs you eat. Counting carbs is important to keep your blood glucose at a healthy level, especially if you use insulin or take certain oral diabetes medicines. It is important to know how many carbs you can safely have in each meal. This is different for every person. Your dietitian can help you calculate how many carbs you should have at each meal and for each snack. Foods that contain carbs include:  Bread, cereal, rice, pasta, and crackers.  Potatoes and corn.  Peas, beans, and lentils.  Milk and yogurt.  Fruit and juice.  Desserts, such as cakes, cookies, ice cream, and candy. How does alcohol affect me? Alcohol can cause a sudden decrease in blood glucose (hypoglycemia), especially if you use insulin or take certain oral diabetes medicines. Hypoglycemia can be a life-threatening condition. Symptoms of hypoglycemia (sleepiness, dizziness, and confusion) are similar to symptoms of having too much alcohol. If your health care provider says that alcohol is safe for you, follow these guidelines:  Limit alcohol intake to no more than 1 drink per day for nonpregnant women and 2 drinks per day for men. One drink equals 12 oz of beer, 5 oz of wine, or 1 oz of hard liquor.  Do not drink on an empty stomach.  Keep yourself hydrated with water, diet soda, or unsweetened iced tea.  Keep in mind that regular soda,  juice, and other mixers may contain a lot of sugar and must be counted as carbs. What are tips for following this plan?  Reading food labels  Start by checking the serving size on the "Nutrition Facts" label of packaged foods and drinks. The amount of calories, carbs, fats, and other nutrients listed on the label is based on one serving of the item. Many items contain more than one serving per package.  Check the total grams (g) of carbs in one  serving. You can calculate the number of servings of carbs in one serving by dividing the total carbs by 15. For example, if a food has 30 g of total carbs, it would be equal to 2 servings of carbs.  Check the number of grams (g) of saturated and trans fats in one serving. Choose foods that have low or no amount of these fats.  Check the number of milligrams (mg) of salt (sodium) in one serving. Most people should limit total sodium intake to less than 2,300 mg per day.  Always check the nutrition information of foods labeled as "low-fat" or "nonfat". These foods may be higher in added sugar or refined carbs and should be avoided.  Talk to your dietitian to identify your daily goals for nutrients listed on the label. Shopping  Avoid buying canned, premade, or processed foods. These foods tend to be high in fat, sodium, and added sugar.  Shop around the outside edge of the grocery store. This includes fresh fruits and vegetables, bulk grains, fresh meats, and fresh dairy. Cooking  Use low-heat cooking methods, such as baking, instead of high-heat cooking methods like deep frying.  Cook using healthy oils, such as olive, canola, or sunflower oil.  Avoid cooking with butter, cream, or high-fat meats. Meal planning  Eat meals and snacks regularly, preferably at the same times every day. Avoid going long periods of time without eating.  Eat foods high in fiber, such as fresh fruits, vegetables, beans, and whole grains. Talk to your dietitian about how many servings of carbs you can eat at each meal.  Eat 4-6 ounces (oz) of lean protein each day, such as lean meat, chicken, fish, eggs, or tofu. One oz of lean protein is equal to: ? 1 oz of meat, chicken, or fish. ? 1 egg. ?  cup of tofu.  Eat some foods each day that contain healthy fats, such as avocado, nuts, seeds, and fish. Lifestyle  Check your blood glucose regularly.  Exercise regularly as told by your health care provider.  This may include: ? 150 minutes of moderate-intensity or vigorous-intensity exercise each week. This could be brisk walking, biking, or water aerobics. ? Stretching and doing strength exercises, such as yoga or weightlifting, at least 2 times a week.  Take medicines as told by your health care provider.  Do not use any products that contain nicotine or tobacco, such as cigarettes and e-cigarettes. If you need help quitting, ask your health care provider.  Work with a Social worker or diabetes educator to identify strategies to manage stress and any emotional and social challenges. Questions to ask a health care provider  Do I need to meet with a diabetes educator?  Do I need to meet with a dietitian?  What number can I call if I have questions?  When are the best times to check my blood glucose? Where to find more information:  American Diabetes Association: diabetes.org  Academy of Nutrition and Dietetics: www.eatright.[employer] of Diabetes  and Digestive and Kidney Diseases (NIH): CarFlippers.tn Summary  A healthy meal plan will help you control your blood glucose and maintain a healthy lifestyle.  Working with a diet and nutrition specialist (dietitian) can help you make a meal plan that is best for you.  Keep in mind that carbohydrates (carbs) and alcohol have immediate effects on your blood glucose levels. It is important to count carbs and to use alcohol carefully. This information is not intended to replace advice given to you by your health care provider. Make sure you discuss any questions you have with your health care provider. Document Released: 01/09/2005 Document Revised: 03/27/2017 Document Reviewed: 05/19/2016 Elsevier Patient Education  2020 [employer].

## 2019-04-06 ENCOUNTER — Ambulatory Visit: Payer: No Typology Code available for payment source | Admitting: Registered"

## 2019-04-06 ENCOUNTER — Other Ambulatory Visit: Payer: Self-pay

## 2019-04-07 ENCOUNTER — Ambulatory Visit (INDEPENDENT_AMBULATORY_CARE_PROVIDER_SITE_OTHER): Payer: BC Managed Care – PPO | Admitting: Family Medicine

## 2019-04-07 ENCOUNTER — Encounter: Payer: Self-pay | Admitting: Family Medicine

## 2019-04-07 VITALS — BP 128/70 | HR 100 | Temp 97.7°F | Ht 64.0 in | Wt 220.0 lb

## 2019-04-07 DIAGNOSIS — Z6839 Body mass index (BMI) 39.0-39.9, adult: Secondary | ICD-10-CM

## 2019-04-07 DIAGNOSIS — Z23 Encounter for immunization: Secondary | ICD-10-CM

## 2019-04-07 DIAGNOSIS — E282 Polycystic ovarian syndrome: Secondary | ICD-10-CM | POA: Diagnosis not present

## 2019-04-07 DIAGNOSIS — E119 Type 2 diabetes mellitus without complications: Secondary | ICD-10-CM | POA: Diagnosis not present

## 2019-04-07 NOTE — Progress Notes (Signed)
Subjective  CC:  Chief Complaint  Patient presents with  . Diabetes    HPI: Lisa Palmer is a 28 y.o. female who presents to the office today for follow up of diabetes and problems listed above in the chief complaint.   Diabetes follow up: short term f/u after new dx: see last note. Doing very well. Has given up sodas completely. Eating in and cooking more. Much healthier diet. Down 8 pounds in one month. Motivated to get better. Did try the metformin but gave her GI upset and back pain. Has been on it for 4 weeks without improvement in sxs. We reviewed her lab work. + microabluminuria with nl ratio and normotensive, LDL 91, nl renal function. She has not been able to schedule with diabetes center for education due to work schedule. It would be hard for her to take off work for 3 hour class.   Wt Readings from Last 3 Encounters:  04/07/19 220 lb (99.8 kg)  03/10/19 228 lb 6.1 oz (103.6 kg)  11/08/14 161 lb (73 kg)    BP Readings from Last 3 Encounters:  04/07/19 128/70  03/10/19 132/80  11/08/14 108/64    Assessment  1. New onset type 2 diabetes mellitus ([place])   2. PCOS (polycystic ovarian syndrome)   3. Class 2 severe obesity due to excess calories with serious comorbidity and body mass index (BMI) of 39.0 to 39.9 in adult [facility])      Plan   Diabetes is currently marginally controlled. But should be improving with lifestyle changes. Stop metformin due to intolerance. Work hard on diet and weight loss and recheck in 3 months. Set up with nutritionist here for further diet recommendations and education. Pneumovax updated today. Hold off on statin/ace at this time. Will see how far we can go with lifestyle changes first. Will need eye exam: she defers until next year due to scheduling.   Follow up: Return in about 3 months (around 07/06/2019) for follow up Diabetes.Marland Kitchenschedule with Sam as well for nutrition counseling. No orders of the defined types were placed in this encounter.   No orders of the defined types were placed in this encounter.     Immunization History  Administered Date(s) Administered  . DTaP, 5 pertussis antigens 08/19/1991, 11/22/1991, 02/06/1992, 05/04/1992, 11/06/1995  . HiB (PRP-OMP) 02/06/1992, 05/04/1992, 01/31/1993  . Influenza,inj,Quad PF,6+ Mos 03/10/2019  . MMR 11-12-1991, 11/06/1995  . OPV 08/19/1991, 11/22/1991, 01/31/1993, 11/06/1995  . Tdap 12/07/2009    Diabetes Related Lab Review: Lab Results  Component Value Date   HGBA1C 7.2 02/16/2019    Lab Results  Component Value Date   MICROALBUR 3.6 (H) 03/10/2019   Lab Results  Component Value Date   CREATININE 0.76 03/10/2019   BUN 9 03/10/2019   NA 140 03/10/2019   K 3.9 03/10/2019   CL 107 03/10/2019   CO2 25 03/10/2019   Lab Results  Component Value Date   CHOL 141 03/10/2019   Lab Results  Component Value Date   HDL 32.80 (L) 03/10/2019   Lab Results  Component Value Date   LDLCALC 91 03/10/2019   Lab Results  Component Value Date   TRIG 85.0 03/10/2019   Lab Results  Component Value Date   CHOLHDL 4 03/10/2019   No results found for: LDLDIRECT The ASCVD Risk score Mikey Bussing DC Jr., et al., 2013) failed to calculate for the following reasons:   The 2013 ASCVD risk score is only valid for ages 54 to 54 I  have reviewed the PMH, Fam and Soc history. Patient Active Problem List   Diagnosis Date Noted  . Migraine 03/10/2019  . New onset type 2 diabetes mellitus ([place]) 03/10/2019  . Dysmenorrhea   . PCOS (polycystic ovarian syndrome) 02/2019  . Class 2 severe obesity due to excess calories with serious comorbidity and body mass index (BMI) of 39.0 to 39.9 in adult [facility]) 08/30/2008    Social History: Patient  reports that she has been smoking. She has been smoking about 0.25 packs per day. She has never used smokeless tobacco. She reports current alcohol use. She reports current drug use. Drug: Marijuana.  Review of Systems: Ophthalmic: negative for eye  pain, loss of vision or double vision Cardiovascular: negative for chest pain Respiratory: negative for SOB or persistent cough Gastrointestinal: negative for abdominal pain Genitourinary: negative for dysuria or gross hematuria MSK: negative for foot lesions Neurologic: negative for weakness or gait disturbance  Objective  Vitals: BP 128/70 (BP Location: Left Arm, Patient Position: Sitting, Cuff Size: Normal)   Pulse 100   Temp 97.7 F (36.5 C) (Temporal)   Ht _0  (1.626 m)   Wt 220 lb (99.8 kg)   SpO2 97%   BMI 37.76 kg/m  General: well appearing, no acute distress  Psych:  Alert and oriented, normal mood and affect Cardiovascular:  Nl S1 and S2, RRR without murmur, gallop or rub. no edema Foot exam: no erythema, pallor, or cyanosis visible nl proprioception and sensation to monofilament testing bilaterally, +2 distal pulses bilaterally    Diabetic education: ongoing education regarding chronic disease management for diabetes was given today. We continue to reinforce the ABC's of diabetic management: A1c (<7 or 8 dependent upon patient), tight blood pressure control, and cholesterol management with goal LDL < 100 minimally. We discuss diet strategies, exercise recommendations, medication options and possible side effects. At each visit, we review recommended immunizations and preventive care recommendations for diabetics and stress that good diabetic control can prevent other problems. See below for this patient's data.    Commons side effects, risks, benefits, and alternatives for medications and treatment plan prescribed today were discussed, and the patient expressed understanding of the given instructions. Patient is instructed to call or message via MyChart if he/she has any questions or concerns regarding our treatment plan. No barriers to understanding were identified. We discussed Red Flag symptoms and signs in detail. Patient expressed understanding regarding what to do in  case of urgent or emergency type symptoms.   Medication list was reconciled, printed and provided to the patient in AVS. Patient instructions and summary information was reviewed with the patient as documented in the AVS. This note was prepared with assistance of Dragon voice recognition software. Occasional wrong-word or sound-a-like substitutions may have occurred due to the inherent limitations of voice recognition software  This visit occurred during the SARS-CoV-2 public health emergency.  Safety protocols were in place, including screening questions prior to the visit, additional usage of staff PPE, and extensive cleaning of exam room while observing appropriate contact time as indicated for disinfecting solutions.

## 2019-04-07 NOTE — Addendum Note (Signed)
Addended by: Elmer Bales on: 04/07/2019 10:01 AM   Modules accepted: Orders

## 2019-04-07 NOTE — Patient Instructions (Signed)
Please return in 3 months for diabetes follow up with me.  Please schedule with Len Blalock, PA for nutrition education.   Great work with lifestyle changes. Keep it up. It will help!  If you have any questions or concerns, please don't hesitate to send me a message via MyChart or call the office at [phone]. Thank you for visiting with [place] today! It's our pleasure caring for you.   Diabetes Mellitus and Standards of Medical Care Managing diabetes (diabetes mellitus) can be complicated. Your diabetes treatment may be managed by a team of health care providers, including:  A physician who specializes in diabetes (endocrinologist).  A nurse practitioner or physician assistant.  Nurses.  A diet and nutrition specialist (registered dietitian).  A certified diabetes educator (CDE).  An exercise specialist.  A pharmacist.  An eye doctor.  A foot specialist (podiatrist).  A dentist.  A primary care provider.  A mental health provider. Your health care providers follow guidelines to help you get the best quality of care. The following schedule is a general guideline for your diabetes management plan. Your health care providers may give you more specific instructions. Physical exams Upon being diagnosed with diabetes mellitus, and each year after that, your health care provider will ask about your medical and family history. He or she will also do a physical exam. Your exam may include:  Measuring your height, weight, and body mass index (BMI).  Checking your blood pressure. This will be done at every routine medical visit. Your target blood pressure may vary depending on your medical conditions, your age, and other factors.  Thyroid gland exam.  Skin exam.  Screening for damage to your nerves (peripheral neuropathy). This may include checking the pulse in your legs and feet and checking the level of sensation in your hands and feet.  A complete foot exam to inspect the  structure and skin of your feet, including checking for cuts, bruises, redness, blisters, sores, or other problems.  Screening for blood vessel (vascular) problems, which may include checking the pulse in your legs and feet and checking your temperature. Blood tests Depending on your treatment plan and your personal needs, you may have the following tests done:  HbA1c (hemoglobin A1c). This test provides information about blood sugar (glucose) control over the previous 2-3 months. It is used to adjust your treatment plan, if needed. This test will be done: ? At least 2 times a year, if you are meeting your treatment goals. ? 4 times a year, if you are not meeting your treatment goals or if treatment goals have changed.  Lipid testing, including total, LDL, and HDL cholesterol and triglyceride levels. ? The goal for LDL is less than 100 mg/dL (5.5 mmol/L). If you are at high risk for complications, the goal is less than 70 mg/dL (3.9 mmol/L). ? The goal for HDL is 40 mg/dL (2.2 mmol/L) or higher for men and 50 mg/dL (2.8 mmol/L) or higher for women. An HDL cholesterol of 60 mg/dL (3.3 mmol/L) or higher gives some protection against heart disease. ? The goal for triglycerides is less than 150 mg/dL (8.3 mmol/L).  Liver function tests.  Kidney function tests.  Thyroid function tests. Dental and eye exams  Visit your dentist two times a year.  If you have type 1 diabetes, your health care provider may recommend an eye exam 3-5 years after you are diagnosed, and then once a year after your first exam. ? For children with type 1 diabetes,  a health care provider may recommend an eye exam when your child is age 89 or older and has had diabetes for 3-5 years. After the first exam, your child should get an eye exam once a year.  If you have type 2 diabetes, your health care provider may recommend an eye exam as soon as you are diagnosed, and then once a year after your first exam. Immunizations    The yearly flu (influenza) vaccine is recommended for everyone 6 months or older who has diabetes.  The pneumonia (pneumococcal) vaccine is recommended for everyone 2 years or older who has diabetes. If you are 67 or older, you may get the pneumonia vaccine as a series of two separate shots.  The hepatitis B vaccine is recommended for adults shortly after being diagnosed with diabetes.  Adults and children with diabetes should receive all other vaccines according to age-specific recommendations from the Centers for Disease Control and Prevention (CDC). Mental and emotional health Screening for symptoms of eating disorders, anxiety, and depression is recommended at the time of diagnosis and afterward as needed. If your screening shows that you have symptoms (positive screening result), you may need more evaluation and you may work with a mental health care provider. Treatment plan Your treatment plan will be reviewed at every medical visit. You and your health care provider will discuss:  How you are taking your medicines, including insulin.  Any side effects you are experiencing.  Your blood glucose target goals.  The frequency of your blood glucose monitoring.  Lifestyle habits, such as activity level as well as tobacco, alcohol, and substance use. Diabetes self-management education Your health care provider will assess how well you are monitoring your blood glucose levels and whether you are taking your insulin correctly. He or she may refer you to:  A certified diabetes educator to manage your diabetes throughout your life, starting at diagnosis.  A registered dietitian who can create or review your personal nutrition plan.  An exercise specialist who can discuss your activity level and exercise plan. Summary  Managing diabetes (diabetes mellitus) can be complicated. Your diabetes treatment may be managed by a team of health care providers.  Your health care providers follow  guidelines in order to help you get the best quality of care.  Standards of care including having regular physical exams, blood tests, blood pressure monitoring, immunizations, screening tests, and education about how to manage your diabetes.  Your health care providers may also give you more specific instructions based on your individual health. This information is not intended to replace advice given to you by your health care provider. Make sure you discuss any questions you have with your health care provider. Document Released: 02/09/2009 Document Revised: 01/01/2018 Document Reviewed: 01/11/2016 Elsevier Patient Education  2020 [employer].

## 2019-04-08 ENCOUNTER — Ambulatory Visit (INDEPENDENT_AMBULATORY_CARE_PROVIDER_SITE_OTHER): Payer: BC Managed Care – PPO | Admitting: Physician Assistant

## 2019-04-08 ENCOUNTER — Encounter: Payer: Self-pay | Admitting: Physician Assistant

## 2019-04-08 VITALS — Ht 64.0 in | Wt 220.0 lb

## 2019-04-08 DIAGNOSIS — E119 Type 2 diabetes mellitus without complications: Secondary | ICD-10-CM

## 2019-04-08 DIAGNOSIS — Z6839 Body mass index (BMI) 39.0-39.9, adult: Secondary | ICD-10-CM

## 2019-04-08 DIAGNOSIS — Z713 Dietary counseling and surveillance: Secondary | ICD-10-CM | POA: Diagnosis not present

## 2019-04-08 NOTE — Progress Notes (Signed)
Virtual Visit via Video   I connected with Lisa Palmer on 04/08/19 at 11:00 AM EST by a video enabled telemedicine application and verified that I am speaking with the correct person using two identifiers. Location patient: Home Location provider: [facility], Office Persons participating in the virtual visit: Lisa Palmer, Lisa Motto PA-C, Lisa Mull, Lisa Palmer   I discussed the limitations of evaluation and management by telemedicine and the availability of in person appointments. The patient expressed understanding and agreed to proceed.    Subjective:   HPI:   Nutrition counseling Pt would like to discuss diet and nutrition. Seeing Dr. Asencion Palmer for recent new dx of DM. HgbA1c is 7.2%. Since seeing Dr. Mardelle Palmer, patient has given up sodas, started eating out less. Metformin briefly trialed but stopped 2/2 GI sx. Pt would like to be at a healthy range for her age and height.   Father is diabetic.  Dietary recall: Wakes up at 545am (10am on weekend) Breakfast -- gingerale, juice; breakfast sandwich if she is "really hungry"  Lunch at 1p -- cheeseburger with french fries from [employer]; leftover chicken/rice, spaghetti and then not eat until 1 PM your blood sugar is probably going way up and down throughout the day because it is getting a shot Dinner at 8:30p -- leftover chicken/rice, spaghetti Desserts after dinner -- cake, candy, desserts, ice cream (frosty from [employer]) -- has dessert on weekends  Snacks -- bag of chips Beverages -- drinks mostly water   Likes broccoli -- buys frozen. Does like Crystal Light.   ETOH -- on occasion  Days that she is off, will sleep most of the day. May do just 1-2 meals.   Weight: Wt Readings from Last 15 Encounters:  04/08/19 220 lb (99.8 kg)  04/07/19 220 lb (99.8 kg)  03/10/19 228 lb 6.1 oz (103.6 kg)  11/08/14 161 lb (73 kg)  11/07/14 161 lb (73 kg)  07/16/14 180 lb (81.6 kg)   Exercise: On feet all of the time  with work and walks dog at least 30 minutes day  Support system: Parents  Sleep: Does sleep a lot on her days off but feels like she is a Lisa Palmer"  Goals: 1- decrease blood sugars 2- lose weight  Estimated daily energy needs: Calories: 1400-1600 kcal Protein: 60-70 g Fluid: at least 1800 ml   ROS: See pertinent positives and negatives per HPI.  Patient Active Problem List   Diagnosis Date Noted  . Migraine 03/10/2019  . New onset type 2 diabetes mellitus (HCC) 03/10/2019  . Dysmenorrhea   . PCOS (polycystic ovarian syndrome) 02/2019  . Class 2 severe obesity due to excess calories with serious comorbidity and body mass index (BMI) of 39.0 to 39.9 in adult [facility]) 08/30/2008    Social History   Tobacco Use  . Smoking status: Current Some Day Smoker    Packs/day: 0.25  . Smokeless tobacco: Never Used  Substance Use Topics  . Alcohol use: Yes    Comment: occasional    Current Outpatient Medications:  .  BLISOVI 24 FE 1-20 MG-MCG(24) tablet, Take 1 tablet by mouth daily., Disp: , Rfl:  .  ibuprofen (ADVIL) 200 MG tablet, Take 400-600 mg by mouth every 6 (six) hours as needed., Disp: , Rfl:   Allergies  Allergen Reactions  . Augmentin [Amoxicillin-Pot Clavulanate] Rash    Objective:   VITALS: Per patient if applicable, see vitals. GENERAL: Alert, appears well and in no acute distress. HEENT: Atraumatic, conjunctiva clear, no obvious  abnormalities on inspection of external nose and ears. NECK: Normal movements of the head and neck. CARDIOPULMONARY: No increased WOB. Speaking in clear sentences. I:E ratio WNL.  MS: Moves all visible extremities without noticeable abnormality. PSYCH: Pleasant and cooperative, well-groomed. Speech normal rate and rhythm. Affect is appropriate. Insight and judgement are appropriate. Attention is focused, linear, and appropriate.  NEURO: CN grossly intact. Oriented as arrived to appointment on time with no prompting. Moves both UE  equally.  SKIN: No obvious lesions, wounds, erythema, or cyanosis noted on face or hands.  Assessment and Plan:   Lisa Palmer was seen today for nutrition counseling.  Diagnoses and all orders for this visit:  Encounter for nutritional counseling  New onset type 2 diabetes mellitus ([place])  Class 2 severe obesity due to excess calories with serious comorbidity and body mass index (BMI) of 39.0 to 39.9 in adult [facility])   Discussed specific, individualized recommendations regarding nutrition including decreasing sugary beverages, limiting portions, balancing out meals, and eating regularly throughout the day. Handouts to be mailed include: Balanced Snack List, Balanced Breakfast. Provided emotional support and encouraged slow, steady weight loss. Patient's questions answered throughout encounter. Follow-up with me prn.   . Reviewed expectations re: course of current medical issues. . Discussed self-management of symptoms. . Outlined signs and symptoms indicating need for more acute intervention. . Patient verbalized understanding and all questions were answered. Marland Kitchen Health Maintenance issues including appropriate healthy diet, exercise, and smoking avoidance were discussed with patient. . See orders for this visit as documented in the electronic medical record.  I discussed the assessment and treatment plan with the patient. The patient was provided an opportunity to ask questions and all were answered. The patient agreed with the plan and demonstrated an understanding of the instructions.   The patient was advised to call back or seek an in-person evaluation if the symptoms worsen or if the condition fails to improve as anticipated.   CMA or Lisa Palmer served as scribe during this visit. History, Physical, and Plan performed by medical provider. The above documentation has been reviewed and is accurate and complete.  I spent 40 minutes with this patient, greater than 50% was face-to-face time  counseling regarding the above diagnoses.   [place], [place] 04/08/2019

## 2019-06-22 ENCOUNTER — Ambulatory Visit: Payer: No Typology Code available for payment source | Admitting: Family Medicine

## 2019-07-08 ENCOUNTER — Ambulatory Visit: Payer: BC Managed Care – PPO | Admitting: Family Medicine

## 2019-07-08 DIAGNOSIS — Z0289 Encounter for other administrative examinations: Secondary | ICD-10-CM

## 2019-08-24 ENCOUNTER — Encounter: Payer: Self-pay | Admitting: Family Medicine

## 2022-01-20 ENCOUNTER — Encounter: Payer: Self-pay | Admitting: *Deleted

## 2022-04-10 ENCOUNTER — Encounter: Payer: Self-pay | Admitting: *Deleted
